# Patient Record
Sex: Male | Born: 1950 | Race: White | Hispanic: No | Marital: Single | Smoking: Never smoker
Health system: Southern US, Community
[De-identification: ages and names within clinical notes are randomized; demographics above are authoritative.]

## PROBLEM LIST (undated history)

## (undated) DIAGNOSIS — J449 Chronic obstructive pulmonary disease, unspecified: Secondary | ICD-10-CM

## (undated) DIAGNOSIS — K4090 Unilateral inguinal hernia, without obstruction or gangrene, not specified as recurrent: Secondary | ICD-10-CM

---

## 2003-08-06 ENCOUNTER — Emergency Department (HOSPITAL_COMMUNITY): Admission: EM | Admit: 2003-08-06 | Discharge: 2003-08-07 | Payer: Self-pay | Admitting: *Deleted

## 2010-12-27 ENCOUNTER — Ambulatory Visit (HOSPITAL_COMMUNITY)
Admission: RE | Admit: 2010-12-27 | Discharge: 2010-12-27 | Disposition: A | Discharge status: Home / Self Care | Payer: BC Managed Care – PPO | Source: Ambulatory Visit | Attending: Family Medicine | Admitting: Family Medicine

## 2010-12-27 ENCOUNTER — Other Ambulatory Visit (HOSPITAL_COMMUNITY): Payer: Self-pay | Admitting: Family Medicine

## 2010-12-27 DIAGNOSIS — R059 Cough, unspecified: Secondary | ICD-10-CM | POA: Insufficient documentation

## 2010-12-27 DIAGNOSIS — R05 Cough: Secondary | ICD-10-CM

## 2010-12-27 DIAGNOSIS — R918 Other nonspecific abnormal finding of lung field: Secondary | ICD-10-CM | POA: Insufficient documentation

## 2011-01-05 ENCOUNTER — Other Ambulatory Visit (HOSPITAL_COMMUNITY): Payer: Self-pay | Admitting: Family Medicine

## 2011-01-05 ENCOUNTER — Ambulatory Visit (HOSPITAL_COMMUNITY)
Admission: RE | Admit: 2011-01-05 | Discharge: 2011-01-05 | Disposition: A | Discharge status: Home / Self Care | Payer: BC Managed Care – PPO | Source: Ambulatory Visit | Attending: Family Medicine | Admitting: Family Medicine

## 2011-01-05 DIAGNOSIS — R0609 Other forms of dyspnea: Secondary | ICD-10-CM

## 2011-01-05 DIAGNOSIS — J449 Chronic obstructive pulmonary disease, unspecified: Secondary | ICD-10-CM

## 2011-01-05 DIAGNOSIS — R0989 Other specified symptoms and signs involving the circulatory and respiratory systems: Secondary | ICD-10-CM | POA: Insufficient documentation

## 2011-01-05 DIAGNOSIS — J4489 Other specified chronic obstructive pulmonary disease: Secondary | ICD-10-CM | POA: Insufficient documentation

## 2011-01-09 ENCOUNTER — Other Ambulatory Visit (HOSPITAL_COMMUNITY): Payer: Self-pay | Admitting: Family Medicine

## 2011-01-09 DIAGNOSIS — R590 Localized enlarged lymph nodes: Secondary | ICD-10-CM

## 2011-01-12 ENCOUNTER — Ambulatory Visit (HOSPITAL_COMMUNITY): Payer: BC Managed Care – PPO

## 2011-01-13 ENCOUNTER — Ambulatory Visit (HOSPITAL_COMMUNITY)
Admission: RE | Admit: 2011-01-13 | Discharge: 2011-01-13 | Disposition: A | Discharge status: Home / Self Care | Payer: BC Managed Care – PPO | Source: Ambulatory Visit | Attending: Family Medicine | Admitting: Family Medicine

## 2011-01-13 DIAGNOSIS — R599 Enlarged lymph nodes, unspecified: Secondary | ICD-10-CM | POA: Insufficient documentation

## 2011-01-13 DIAGNOSIS — R590 Localized enlarged lymph nodes: Secondary | ICD-10-CM

## 2011-01-13 MED ORDER — IOHEXOL 300 MG/ML  SOLN
80.0000 mL | Freq: Once | INTRAMUSCULAR | Status: AC | PRN
  Administered 2011-01-13: 80 mL via INTRAVENOUS

## 2011-07-17 ENCOUNTER — Encounter (HOSPITAL_COMMUNITY): Payer: Self-pay | Admitting: *Deleted

## 2011-07-17 ENCOUNTER — Emergency Department (HOSPITAL_COMMUNITY): Payer: BC Managed Care – PPO

## 2011-07-17 ENCOUNTER — Emergency Department (HOSPITAL_COMMUNITY)
Admission: EM | Admit: 2011-07-17 | Discharge: 2011-07-18 | Disposition: A | Discharge status: Home / Self Care | Payer: BC Managed Care – PPO | Attending: Emergency Medicine | Admitting: Emergency Medicine

## 2011-07-17 DIAGNOSIS — J45901 Unspecified asthma with (acute) exacerbation: Secondary | ICD-10-CM | POA: Insufficient documentation

## 2011-07-17 DIAGNOSIS — Z9114 Patient's other noncompliance with medication regimen: Secondary | ICD-10-CM

## 2011-07-17 DIAGNOSIS — Z91199 Patient's noncompliance with other medical treatment and regimen due to unspecified reason: Secondary | ICD-10-CM | POA: Insufficient documentation

## 2011-07-17 DIAGNOSIS — Z9119 Patient's noncompliance with other medical treatment and regimen: Secondary | ICD-10-CM | POA: Insufficient documentation

## 2011-07-17 MED ORDER — ALBUTEROL SULFATE (5 MG/ML) 0.5% IN NEBU: 5.0000 mg | INHALATION_SOLUTION | Freq: Once | RESPIRATORY_TRACT | Status: DC

## 2011-07-17 MED ORDER — PREDNISONE 10 MG PO TABS: 20.0000 mg | ORAL_TABLET | Freq: Every day | ORAL | Status: DC

## 2011-07-17 MED ORDER — IPRATROPIUM BROMIDE 0.02 % IN SOLN
0.5000 mg | Freq: Once | RESPIRATORY_TRACT | Status: AC
  Administered 2011-07-17: 0.5 mg via RESPIRATORY_TRACT
  Filled 2011-07-17: qty 2.5

## 2011-07-17 MED ORDER — IPRATROPIUM BROMIDE 0.02 % IN SOLN: 0.5000 mg | Freq: Once | RESPIRATORY_TRACT | Status: DC

## 2011-07-17 MED ORDER — ALBUTEROL SULFATE HFA 108 (90 BASE) MCG/ACT IN AERS
2.0000 | INHALATION_SPRAY | RESPIRATORY_TRACT | Status: DC
  Administered 2011-07-18: 2 via RESPIRATORY_TRACT
  Filled 2011-07-17: qty 6.7

## 2011-07-17 MED ORDER — ALBUTEROL SULFATE (5 MG/ML) 0.5% IN NEBU
5.0000 mg | INHALATION_SOLUTION | Freq: Once | RESPIRATORY_TRACT | Status: AC
  Administered 2011-07-17: 5 mg via RESPIRATORY_TRACT
  Filled 2011-07-17: qty 1

## 2011-07-17 MED ORDER — PREDNISONE 20 MG PO TABS
60.0000 mg | ORAL_TABLET | Freq: Once | ORAL | Status: AC
  Administered 2011-07-17: 60 mg via ORAL
  Filled 2011-07-17: qty 3

## 2011-07-17 NOTE — Discharge Instructions (Signed)
Asthma, Adult  Asthma is a disease of the lungs and can make it hard to breathe. Asthma cannot be cured, but medicine can help control it. Asthma may be started (triggered) by:   Pollen.   Dust.   Animal skin flakes (dander).   Molds.   Foods.   Respiratory infections (colds, flu).   Smoke.   Exercise.   Stress.   Other things that cause allergic reactions or allergies (allergens).  HOME CARE    Talk to your doctor about how to manage your attacks at home. This may include:   Using a tool called a peak flow meter.   Having medicine ready to stop the attack.   Take all medicine as told by your doctor.   Wash bed sheets and blankets every week in hot water and put them in the dryer.   Drink enough fluids to keep your pee (urine) clear or pale yellow.   Always be ready to get emergency help. Write down the phone number for your doctor. Keep it where you can easily find it.   Talk about exercise routines with your doctor.   If animal dander is causing your asthma, you may need to find a new home for your pet(s).  GET HELP RIGHT AWAY IF:    You have muscle aches.   You cough more.   You have chest pain.   You have thick spit (sputum) that changes to yellow, green, gray, or bloody.   Medicine does not stop your wheezing.   You have problems breathing.   You have a fever.   Your medicine causes:   A rash.   Itching.   Puffiness (swelling).   Breathing problems.  MAKE SURE YOU:    Understand these instructions.   Will watch your condition.   Will get help right away if you are not doing well or get worse.  Document Released: 09/06/2007 Document Revised: 03/09/2011 Document Reviewed: 01/29/2008  [employer] Patient Information 2012 [employer].

## 2011-07-17 NOTE — ED Notes (Signed)
Sob today,vomited x2 today, No cough,  Diarrhea x2,

## 2011-07-17 NOTE — ED Provider Notes (Signed)
History    This chart was scribed for Brent Quarry, MD, MD by [name]. The patient was seen in room APA05 and the patient's care was started at 10:32PM.   CSN: [id number]  Arrival date & time 07/17/11  2130   First MD Initiated Contact with Patient 07/17/11 2230      Chief Complaint  Patient presents with  . Shortness of Breath    (Consider location/radiation/quality/duration/timing/severity/associated sxs/prior treatment) Patient is a 61 y.o. male presenting with shortness of breath. The history is provided by the patient.  Shortness of Breath  Associated symptoms include shortness of breath.   Brent Collins is a 61 y.o. male who presents to the Emergency Department complaining of moderate SOB onset today. Pt denies using inhaler due to running out. He reports having hx of asthma. He denies ever being admitted to hospital for breathing complications. The symptoms have been constant without radiation. He reports vomiting 2x today. Denies cough. Pt denies any other health problems.   Past Medical History  Diagnosis Date  . Asthma     History reviewed. No pertinent past surgical history.  History reviewed. No pertinent family history.  History  Substance Use Topics  . Smoking status: Never Smoker   . Smokeless tobacco: Not on file  . Alcohol Use: No      Review of Systems  Respiratory: Positive for shortness of breath.   All other systems reviewed and are negative.  10 Systems reviewed and all are negative for acute change except as noted in the HPI.    Allergies  Tomato  Home Medications   Current Outpatient Rx  Name Route Sig Dispense Refill  . GOODY HEADACHE PO Oral Take 1 packet by mouth as needed. For pain      BP 164/107  Pulse 84  Temp(Src) 97.9 F (36.6 C) (Oral)  Resp 24  Wt 139 lb 6.4 oz (63.231 kg)  SpO2 99%  Physical Exam  Nursing note and vitals reviewed. Constitutional: He is oriented to person, place, and time. He appears  well-developed and well-nourished. No distress.  HENT:  Head: Normocephalic and atraumatic.  Eyes: EOM are normal. Pupils are equal, round, and reactive to light.  Neck: Normal range of motion. Neck supple. No tracheal deviation present.  Cardiovascular: Normal rate.   Pulmonary/Chest: Effort normal. No respiratory distress. He has wheezes.  Abdominal: Soft. He exhibits no distension.  Musculoskeletal: Normal range of motion.  Neurological: He is alert and oriented to person, place, and time.  Skin: Skin is warm and dry.  Psychiatric: He has a normal mood and affect. His behavior is normal.    ED Course  Procedures (including critical care time) DIAGNOSTIC STUDIES: Oxygen Saturation is 99% on room air, normal by my interpretation.    COORDINATION OF CARE: 10:36PM EDP discusses pt ED treatment course with pt.  10:36PM EDP ordered medication: albuterol 0.5%, atrovent 0.5 mg    Labs Reviewed - No data to display No results found.   No diagnosis found.    MDM  wheezing s has resolved after one nebulizer treatment. Patient states that he was out of his albuterol inhaler. He is advised that his blood pressure is high here. He will have her rechecked tomorrow. He is given albuterol MDI here. He'll also be placed on prednisone.  I personally performed the services described in this documentation, which was scribed in my presence. The recorded information has been reviewed and considered.     Brent Quarry,  MD 07/17/11 2336

## 2013-03-02 ENCOUNTER — Emergency Department (HOSPITAL_COMMUNITY)
Admission: EM | Admit: 2013-03-02 | Discharge: 2013-03-02 | Disposition: A | Discharge status: Home / Self Care | Payer: BC Managed Care – PPO | Attending: Emergency Medicine | Admitting: Emergency Medicine

## 2013-03-02 ENCOUNTER — Other Ambulatory Visit: Payer: Self-pay

## 2013-03-02 ENCOUNTER — Encounter (HOSPITAL_COMMUNITY): Payer: Self-pay | Admitting: Emergency Medicine

## 2013-03-02 ENCOUNTER — Emergency Department (HOSPITAL_COMMUNITY): Payer: BC Managed Care – PPO

## 2013-03-02 DIAGNOSIS — J45901 Unspecified asthma with (acute) exacerbation: Secondary | ICD-10-CM | POA: Insufficient documentation

## 2013-03-02 DIAGNOSIS — Z79899 Other long term (current) drug therapy: Secondary | ICD-10-CM | POA: Insufficient documentation

## 2013-03-02 LAB — CBC WITH DIFFERENTIAL/PLATELET
Eosinophils Absolute: 0.5 10*3/uL (ref 0.0–0.7)
Eosinophils Relative: 5 % (ref 0–5)
HCT: 48.7 % (ref 39.0–52.0)
Hemoglobin: 16.7 g/dL (ref 13.0–17.0)
Lymphs Abs: 1.8 10*3/uL (ref 0.7–4.0)
MCH: 30.7 pg (ref 26.0–34.0)
MCV: 89.5 fL (ref 78.0–100.0)
Monocytes Absolute: 0.8 10*3/uL (ref 0.1–1.0)
Monocytes Relative: 8 % (ref 3–12)
RBC: 5.44 MIL/uL (ref 4.22–5.81)

## 2013-03-02 LAB — BASIC METABOLIC PANEL
BUN: 19 mg/dL (ref 6–23)
Calcium: 9 mg/dL (ref 8.4–10.5)
Creatinine, Ser: 0.94 mg/dL (ref 0.50–1.35)
GFR calc non Af Amer: 87 mL/min — ABNORMAL LOW (ref >90)
Glucose, Bld: 182 mg/dL — ABNORMAL HIGH (ref 70–99)
Potassium: 3.9 mEq/L (ref 3.5–5.1)

## 2013-03-02 MED ORDER — ALBUTEROL SULFATE HFA 108 (90 BASE) MCG/ACT IN AERS: 2.0000 | INHALATION_SPRAY | RESPIRATORY_TRACT | Status: DC | PRN

## 2013-03-02 MED ORDER — ALBUTEROL SULFATE (5 MG/ML) 0.5% IN NEBU
5.0000 mg | INHALATION_SOLUTION | Freq: Once | RESPIRATORY_TRACT | Status: AC
  Administered 2013-03-02: 5 mg via RESPIRATORY_TRACT
  Filled 2013-03-02: qty 1

## 2013-03-02 MED ORDER — PREDNISONE 10 MG PO TABS: 60.0000 mg | ORAL_TABLET | Freq: Every day | ORAL | Status: DC

## 2013-03-02 MED ORDER — METHYLPREDNISOLONE SODIUM SUCC 125 MG IJ SOLR
125.0000 mg | Freq: Once | INTRAMUSCULAR | Status: AC
  Administered 2013-03-02: 125 mg via INTRAVENOUS
  Filled 2013-03-02: qty 2

## 2013-03-02 MED ORDER — IPRATROPIUM BROMIDE 0.02 % IN SOLN
0.5000 mg | Freq: Once | RESPIRATORY_TRACT | Status: AC
  Administered 2013-03-02: 0.5 mg via RESPIRATORY_TRACT
  Filled 2013-03-02: qty 2.5

## 2013-03-02 NOTE — ED Notes (Signed)
Pt reports sob, pt w/ audible wheezing when came to the ER. Pt states productive cough w/ white phlegm.

## 2013-03-02 NOTE — ED Provider Notes (Signed)
CSN: [id number]     Arrival date & time 03/02/13  0320 History   First MD Initiated Contact with Patient 03/02/13 0331     Chief Complaint  Patient presents with  . Shortness of Breath  . Wheezing   (Consider location/radiation/quality/duration/timing/severity/associated sxs/prior Treatment) The history is provided by the patient.   patient is a known history of asthma.  He states worsening shortness of breath with productive cough and developing phlegm over the past 24-48 hours.  He's tried breathing treatments at home without improvement in his symptoms.  He's never been hospitalized or intubated for his asthma.  He denies chest pain at this time.  Mild shortness of breath.  No recent fevers or chills.  No abdominal pain.  No back pain or flank pain.  No neck pain.  He does not smoke cigarettes.  Past Medical History  Diagnosis Date  . Asthma    History reviewed. No pertinent past surgical history. No family history on file. History  Substance Use Topics  . Smoking status: Never Smoker   . Smokeless tobacco: Not on file  . Alcohol Use: No    Review of Systems  All other systems reviewed and are negative.    Allergies  Tomato  Home Medications   Current Outpatient Rx  Name  Route  Sig  Dispense  Refill  . Aspirin-Acetaminophen-Caffeine (GOODY HEADACHE PO)   Oral   Take 1 packet by mouth as needed. For pain         . albuterol (PROVENTIL HFA;VENTOLIN HFA) 108 (90 BASE) MCG/ACT inhaler   Inhalation   Inhale 2 puffs into the lungs every 4 (four) hours as needed for wheezing or shortness of breath.   1 Inhaler   0   . predniSONE (DELTASONE) 10 MG tablet   Oral   Take 2 tablets (20 mg total) by mouth daily.   15 tablet   0   . predniSONE (DELTASONE) 10 MG tablet   Oral   Take 6 tablets (60 mg total) by mouth daily.   30 tablet   0    BP 134/80  Pulse 83  Temp(Src) 97.6 F (36.4 C) (Oral)  Resp 20  Ht 5\' 10"  (1.778 m)  Wt 150 lb (68.04 kg)  BMI 21.52  kg/m2  SpO2 98% Physical Exam  Nursing note and vitals reviewed. Constitutional: He is oriented to person, place, and time. He appears well-developed and well-nourished.  HENT:  Head: Normocephalic and atraumatic.  Eyes: EOM are normal.  Neck: Normal range of motion.  Cardiovascular: Normal rate, regular rhythm, normal heart sounds and intact distal pulses.   Pulmonary/Chest: No accessory muscle usage. Tachypnea noted. Not bradypneic. No respiratory distress. He has no decreased breath sounds. He has wheezes. He has no rhonchi. He has no rales.  Abdominal: Soft. He exhibits no distension. There is no tenderness.  Musculoskeletal: Normal range of motion. He exhibits no edema and no tenderness.  Neurological: He is alert and oriented to person, place, and time.  Skin: Skin is warm and dry.  Psychiatric: He has a normal mood and affect. Judgment normal.    ED Course  Procedures (including critical care time) Labs Review Labs Reviewed  BASIC METABOLIC PANEL - Abnormal; Notable for the following:    Glucose, Bld 182 (*)    GFR calc non Af Amer 87 (*)    All other components within normal limits  CBC WITH DIFFERENTIAL   Imaging Review Dg Chest Portable 1 View  03/02/2013  CLINICAL DATA:  Shortness of breath.  History of asthma.  EXAM: PORTABLE CHEST - 1 VIEW  COMPARISON:  07/17/2011  FINDINGS: Normal heart size and pulmonary vascularity. Central interstitial changes with peribronchial thickening suggesting chronic bronchitis. Stable appearance since previous study. No focal consolidation or airspace disease. Vague nodular opacity over the right mid lung is probably a prominent nipple shadow. No blunting of costophrenic angles. No pneumothorax.  IMPRESSION: Chronic bronchitic changes.  No evidence of active pulmonary disease   Electronically Signed   By: Burman Nieves M.D.   On: 03/02/2013 04:35    ECG interpretation   Date: 03/02/2013  Rate: 89  Rhythm: normal sinus rhythm  QRS  Axis: normal  Intervals: normal  ST/T Wave abnormalities: nonspecific ST changes  Conduction Disutrbances: none  Narrative Interpretation: artifact present, poor baseline  Old EKG Reviewed: No significant changes noted     MDM   1. Asthma exacerbation   6:12 AM Patient feels much better this time.  Wheezing is resolved.  Chest x-ray without abnormalities.  Discharge home with prednisone and an ongoing schedule albuterol for the first 48 hours.  PCP followup.  Understands to return to the ER for new or worsening symptoms  Discharge Medication List as of 03/02/2013  5:57 AM    START taking these medications   Details  albuterol (PROVENTIL HFA;VENTOLIN HFA) 108 (90 BASE) MCG/ACT inhaler Inhale 2 puffs into the lungs every 4 (four) hours as needed for wheezing or shortness of breath., Starting 03/02/2013, Until Discontinued, Print    !! predniSONE (DELTASONE) 10 MG tablet Take 6 tablets (60 mg total) by mouth daily., Starting 03/02/2013, Until Discontinued, Print            Lyanne Co, MD 03/02/13 [phone]

## 2013-03-02 NOTE — ED Notes (Signed)
Pt alert & oriented x4, stable gait. Patient given discharge instructions, paperwork & prescription(s). Patient  instructed to stop at the registration desk to finish any additional paperwork. Patient verbalized understanding. Pt left department w/ no further questions. 

## 2013-03-02 NOTE — ED Notes (Signed)
Pt states breathing better after treatment, pt still has scattered wheezing on occasion.

## 2013-05-10 ENCOUNTER — Encounter (HOSPITAL_COMMUNITY): Payer: Self-pay | Admitting: Emergency Medicine

## 2013-05-10 ENCOUNTER — Emergency Department (HOSPITAL_COMMUNITY)
Admission: EM | Admit: 2013-05-10 | Discharge: 2013-05-10 | Disposition: A | Discharge status: Home / Self Care | Payer: BC Managed Care – PPO | Attending: Emergency Medicine | Admitting: Emergency Medicine

## 2013-05-10 ENCOUNTER — Emergency Department (HOSPITAL_COMMUNITY): Payer: BC Managed Care – PPO

## 2013-05-10 DIAGNOSIS — R112 Nausea with vomiting, unspecified: Secondary | ICD-10-CM | POA: Insufficient documentation

## 2013-05-10 DIAGNOSIS — R000 Tachycardia, unspecified: Secondary | ICD-10-CM | POA: Insufficient documentation

## 2013-05-10 DIAGNOSIS — R609 Edema, unspecified: Secondary | ICD-10-CM | POA: Insufficient documentation

## 2013-05-10 DIAGNOSIS — J45901 Unspecified asthma with (acute) exacerbation: Secondary | ICD-10-CM

## 2013-05-10 HISTORY — DX: Unilateral inguinal hernia, without obstruction or gangrene, not specified as recurrent: K40.90

## 2013-05-10 LAB — BASIC METABOLIC PANEL
BUN: 16 mg/dL (ref 6–23)
CHLORIDE: 100 meq/L (ref 96–112)
CO2: 28 meq/L (ref 19–32)
CREATININE: 0.8 mg/dL (ref 0.50–1.35)
Calcium: 9.1 mg/dL (ref 8.4–10.5)
GFR calc non Af Amer: >90 mL/min (ref >90)
Glucose, Bld: 244 mg/dL — ABNORMAL HIGH (ref 70–99)
POTASSIUM: 3.8 meq/L (ref 3.7–5.3)
Sodium: 142 mEq/L (ref 137–147)

## 2013-05-10 LAB — CBC
HEMATOCRIT: 45.4 % (ref 39.0–52.0)
Hemoglobin: 15.5 g/dL (ref 13.0–17.0)
MCH: 30.5 pg (ref 26.0–34.0)
MCHC: 34.1 g/dL (ref 30.0–36.0)
MCV: 89.2 fL (ref 78.0–100.0)
PLATELETS: 213 10*3/uL (ref 150–400)
RBC: 5.09 MIL/uL (ref 4.22–5.81)
RDW: 13.4 % (ref 11.5–15.5)
WBC: 13.6 10*3/uL — AB (ref 4.0–10.5)

## 2013-05-10 MED ORDER — ALBUTEROL SULFATE (2.5 MG/3ML) 0.083% IN NEBU
5.0000 mg | INHALATION_SOLUTION | Freq: Once | RESPIRATORY_TRACT | Status: AC
  Administered 2013-05-10: 5 mg via RESPIRATORY_TRACT
  Filled 2013-05-10: qty 6

## 2013-05-10 MED ORDER — MAGNESIUM SULFATE 40 MG/ML IJ SOLN
2.0000 g | Freq: Once | INTRAMUSCULAR | Status: AC
  Administered 2013-05-10: 2 g via INTRAVENOUS
  Filled 2013-05-10: qty 50

## 2013-05-10 MED ORDER — PREDNISONE 20 MG PO TABS: 60.0000 mg | ORAL_TABLET | Freq: Every day | ORAL | Status: DC

## 2013-05-10 MED ORDER — ALBUTEROL SULFATE HFA 108 (90 BASE) MCG/ACT IN AERS
2.0000 | INHALATION_SPRAY | RESPIRATORY_TRACT | Status: DC | PRN
  Administered 2013-05-10: 2 via RESPIRATORY_TRACT
  Filled 2013-05-10: qty 6.7

## 2013-05-10 MED ORDER — ONDANSETRON HCL 4 MG/2ML IJ SOLN
INTRAMUSCULAR | Status: AC
  Filled 2013-05-10: qty 2

## 2013-05-10 MED ORDER — ONDANSETRON HCL 4 MG/2ML IJ SOLN
4.0000 mg | Freq: Once | INTRAMUSCULAR | Status: AC
  Administered 2013-05-10: 4 mg via INTRAVENOUS

## 2013-05-10 MED ORDER — ALBUTEROL SULFATE HFA 108 (90 BASE) MCG/ACT IN AERS: 1.0000 | INHALATION_SPRAY | Freq: Four times a day (QID) | RESPIRATORY_TRACT | Status: DC | PRN

## 2013-05-10 NOTE — Discharge Instructions (Signed)
Asthma, Acute Bronchospasm Rest today and use your inhaler as needed and as prescribed.  Take prednisone daily for the next 5 days . Call your physician to schedule close followup in the office.   Return here for any return of symptoms, difficulty breathing or concerning condition.   Acute bronchospasm caused by asthma is also referred to as an asthma attack. Bronchospasm means your air passages become narrowed. The narrowing is caused by inflammation and tightening of the muscles in the air tubes (bronchi) in your lungs. This can make it hard to breath or cause you to wheeze and cough. CAUSES Possible triggers are:  Animal dander from the skin, hair, or feathers of animals.  Dust mites contained in house dust.  Cockroaches.  Pollen from trees or grass.  Mold.  Cigarette or tobacco smoke.  Air pollutants such as dust, household cleaners, hair sprays, aerosol sprays, paint fumes, strong chemicals, or strong odors.  Cold air or weather changes. Cold air may trigger inflammation. Winds increase molds and pollens in the air.  Strong emotions such as crying or laughing hard.  Stress.  Certain medicines such as aspirin or beta-blockers.  Sulfites in foods and drinks, such as dried fruits and wine.  Infections or inflammatory conditions, such as a flu, cold, or inflammation of the nasal membranes (rhinitis).  Gastroesophageal reflux disease (GERD). GERD is a condition where stomach acid backs up into your throat (esophagus).  Exercise or strenuous activity. SIGNS AND SYMPTOMS   Wheezing.  Excessive coughing, particularly at night.  Chest tightness.  Shortness of breath. DIAGNOSIS  Your health care provider will ask you about your medical history and perform a physical exam. A chest X-ray or blood testing may be performed to look for other causes of your symptoms or other conditions that may have triggered your asthma attack. TREATMENT  Treatment is aimed at reducing  inflammation and opening up the airways in your lungs. Most asthma attacks are treated with inhaled medicines. These include quick relief or rescue medicines (such as bronchodilators) and controller medicines (such as inhaled corticosteroids). These medicines are sometimes given through an inhaler or a nebulizer. Systemic steroid medicine taken by mouth or given through an IV tube also can be used to reduce the inflammation when an attack is moderate or severe. Antibiotic medicines are only used if a bacterial infection is present.  HOME CARE INSTRUCTIONS   Rest.  Drink plenty of liquids. This helps the mucus to remain thin and be easily coughed up. Only use caffeine in moderation and do not use alcohol until you have recovered from your illness.  Do not smoke. Avoid being exposed to secondhand smoke.  You play a critical role in keeping yourself in good health. Avoid exposure to things that cause you to wheeze or to have breathing problems.  Keep your medicines up to date and available. Carefully follow your health care provider's treatment plan.  Take your medicine exactly as prescribed.  When pollen or pollution is bad, keep windows closed and use an air conditioner or go to places with air conditioning.  Asthma requires careful medical care. See your health care provider for a follow-up as advised. If you are more than [redacted] weeks pregnant and you were prescribed any new medicines, let your obstetrician know about the visit and how you are doing. Follow-up with your health care provider as directed.  After you have recovered from your asthma attack, make an appointment with your outpatient doctor to talk about ways to reduce  the likelihood of future attacks. If you do not have a doctor who manages your asthma, make an appointment with a primary care doctor to discuss your asthma. SEEK IMMEDIATE MEDICAL CARE IF:   You are getting worse.  You have trouble breathing. If severe, call your local  emergency services (911 in the [place]  You develop chest pain or discomfort.  You are vomiting.  You are not able to keep fluids down.  You are coughing up yellow, green, brown, or bloody sputum.  You have a fever and your symptoms suddenly get worse.  You have trouble swallowing. MAKE SURE YOU:   Understand these instructions.  Will watch your condition.  Will get help right away if you are not doing well or get worse. Document Released: 07/05/2006 Document Revised: 11/20/2012 Document Reviewed: 09/25/2012 [facility] Patient Information 2014 [place], [place].

## 2013-05-10 NOTE — ED Notes (Signed)
O2 sats 88on RA w/good pleth.  Placed 2L [place] w/SpO2 up to 96%

## 2013-05-10 NOTE — ED Notes (Signed)
Patient with no complaints at this time. Respirations even and unlabored. Skin warm/dry. Discharge instructions reviewed with patient at this time. Patient given opportunity to voice concerns/ask questions. IV removed per policy and band-aid applied to site. Patient discharged at this time and left Emergency Department with steady gait.  

## 2013-05-10 NOTE — ED Provider Notes (Signed)
CSN: [id number]     Arrival date & time 05/10/13  0139 History   None    Chief Complaint  Patient presents with  . Shortness of Breath   (Consider location/radiation/quality/duration/timing/severity/associated sxs/prior Treatment) HPI History provided by patient. History of asthma, ran out of his inhaler tonight with progressively worsening symptoms. EMS was called he received albuterol neb in route, in addition to IV site Medrol 125 mg. he reported some subjective improvement. While ambulating into the ER from the ambulance, vomited x1. He reports some mild nausea at this time. No recent fevers or chills. Has a dry cough tonight. No nausea vomiting or diarrhea otherwise. Symptoms moderate to severe, worse with exertion. No known sick contacts. No recent travel. Feels like an asthma exacerbation.  Past Medical History  Diagnosis Date  . Asthma    No past surgical history on file. No family history on file. History  Substance Use Topics  . Smoking status: Never Smoker   . Smokeless tobacco: Not on file  . Alcohol Use: No    Review of Systems  Constitutional: Negative for fever and chills.  Respiratory: Positive for shortness of breath and wheezing.   Cardiovascular: Negative for chest pain.  Gastrointestinal: Positive for nausea and vomiting. Negative for abdominal pain.  Genitourinary: Negative for flank pain.  Musculoskeletal: Negative for back pain.  Skin: Negative for rash.  Neurological: Negative for weakness and numbness.  All other systems reviewed and are negative.    Allergies  Tomato  Home Medications   Current Outpatient Rx  Name  Route  Sig  Dispense  Refill  . albuterol (PROVENTIL HFA;VENTOLIN HFA) 108 (90 BASE) MCG/ACT inhaler   Inhalation   Inhale 2 puffs into the lungs every 4 (four) hours as needed for wheezing or shortness of breath.   1 Inhaler   0   . Aspirin-Acetaminophen-Caffeine (GOODY HEADACHE PO)   Oral   Take 1 packet by mouth as needed. For  pain         . predniSONE (DELTASONE) 10 MG tablet   Oral   Take 2 tablets (20 mg total) by mouth daily.   15 tablet   0   . predniSONE (DELTASONE) 10 MG tablet   Oral   Take 6 tablets (60 mg total) by mouth daily.   30 tablet   0    BP 139/87  Pulse 100  Resp 22  Ht 5\' 6"  (1.676 m)  Wt 150 lb (68.04 kg)  BMI 24.22 kg/m2  SpO2 100% Physical Exam  Constitutional: He is oriented to person, place, and time. He appears well-developed and well-nourished.  HENT:  Head: Normocephalic and atraumatic.  Mouth/Throat: Oropharynx is clear and moist.  Eyes: EOM are normal. Pupils are equal, round, and reactive to light.  Neck: Neck supple. No tracheal deviation present.  Cardiovascular: Regular rhythm and intact distal pulses.   Borderline tachycardia heart rate 100  Pulmonary/Chest: No stridor.  Tachypnea. Bilateral inspiratory and expiratory wheezes  Abdominal: Soft. He exhibits no distension. There is no tenderness.  Musculoskeletal: Normal range of motion. He exhibits no tenderness.  Symmetric trace pretibial edema  Neurological: He is alert and oriented to person, place, and time.  Skin: Skin is warm and dry.    ED Course  Procedures (including critical care time) Labs Review Labs Reviewed  CBC - Abnormal; Notable for the following:    WBC 13.6 (*)    All other components within normal limits  BASIC METABOLIC PANEL - Abnormal; Notable for  the following:    Glucose, Bld 244 (*)    All other components within normal limits   Imaging Review Dg Chest Portable 1 View  05/10/2013   CLINICAL DATA:  63 year old male with shortness of breath.  EXAM: PORTABLE CHEST - 1 VIEW  COMPARISON:  03/02/2013 prior chest radiographs dating back to 12/27/2010  FINDINGS: The cardiomediastinal silhouette is unremarkable.  Mild peribronchial thickening again noted.  There is no evidence of focal airspace disease, pulmonary edema, suspicious pulmonary nodule/mass, pleural effusion, or  pneumothorax. No acute bony abnormalities are identified.  Severe degenerative changes in both shoulders again noted.  IMPRESSION: No active disease.   Electronically Signed   By: Laveda Abbe M.D.   On: 05/10/2013 02:15     Date: 05/10/2013  Rate: 100  Rhythm: normal sinus rhythm  QRS Axis: normal  Intervals: normal  ST/T Wave abnormalities: nonspecific ST changes  Conduction Disutrbances:none  Narrative Interpretation:   Old EKG Reviewed: none available  IV Steroids PTA Albuterol and IV magnesium provided  2:40 AM recheck reports some improvement in his breathing/ appears comfortable, lung sounds remain insp/ exp wheezes, moving air better. Repeated albuterol. No further emesis. Nausea resolved. 5:29 AM recheck at this time in feeling significantly better ambulating without any further shortness of breath or wheezes. Lungs sounds: Moving good air, has some intermittent expiratory wheezes but overall much improved. Patient is requesting to be discharged home. Albuterol inhaler provided  Plan discharge home with close outpatient followup. Prescription for prednisone 60 mg x 5 days.  Patient agrees to strict return precautions.  MDM  Diagnosis: Acute asthma exacerbation  Evaluated with EKG, chest x-ray and labs are reviewed as above Patient received IV steroids in route, treated with albuterol nebulizers in the ED And IV magnesium. Condition improved. Vital signs and nursing notes and previous records reviewed and considered.   Sunnie Nielsen, MD 05/10/13 [phone]

## 2013-05-10 NOTE — ED Notes (Signed)
C/O SOB after 1900.  EMS called.  Received Albuterol neb and 125mg  Solumedrol IV on site.  Began vomiting upon arrival to ER.  Hx of asthma, bronchitis.  Has run out of albuterol at home.

## 2015-11-01 ENCOUNTER — Ambulatory Visit (HOSPITAL_COMMUNITY)
Admission: RE | Admit: 2015-11-01 | Discharge: 2015-11-01 | Disposition: A | Discharge status: Home / Self Care | Payer: Self-pay | Source: Ambulatory Visit | Attending: Internal Medicine | Admitting: Internal Medicine

## 2015-11-01 ENCOUNTER — Other Ambulatory Visit (HOSPITAL_COMMUNITY): Payer: Self-pay | Admitting: Internal Medicine

## 2015-11-01 DIAGNOSIS — R0689 Other abnormalities of breathing: Secondary | ICD-10-CM

## 2015-11-01 DIAGNOSIS — J45909 Unspecified asthma, uncomplicated: Secondary | ICD-10-CM | POA: Insufficient documentation

## 2015-11-01 DIAGNOSIS — R918 Other nonspecific abnormal finding of lung field: Secondary | ICD-10-CM | POA: Insufficient documentation

## 2016-07-03 ENCOUNTER — Emergency Department (HOSPITAL_COMMUNITY): Payer: Medicare Other

## 2016-07-03 ENCOUNTER — Inpatient Hospital Stay (HOSPITAL_COMMUNITY)
Admission: EM | Admit: 2016-07-03 | Discharge: 2016-07-07 | DRG: 292 | Disposition: A | Discharge status: Home / Self Care | Payer: Medicare Other | Attending: Internal Medicine | Admitting: Internal Medicine

## 2016-07-03 ENCOUNTER — Encounter (HOSPITAL_COMMUNITY): Payer: Self-pay | Admitting: *Deleted

## 2016-07-03 DIAGNOSIS — I5033 Acute on chronic diastolic (congestive) heart failure: Secondary | ICD-10-CM | POA: Diagnosis present

## 2016-07-03 DIAGNOSIS — I110 Hypertensive heart disease with heart failure: Principal | ICD-10-CM | POA: Diagnosis present

## 2016-07-03 DIAGNOSIS — E860 Dehydration: Secondary | ICD-10-CM | POA: Diagnosis present

## 2016-07-03 DIAGNOSIS — R739 Hyperglycemia, unspecified: Secondary | ICD-10-CM | POA: Diagnosis present

## 2016-07-03 DIAGNOSIS — J441 Chronic obstructive pulmonary disease with (acute) exacerbation: Secondary | ICD-10-CM | POA: Diagnosis not present

## 2016-07-03 DIAGNOSIS — I509 Heart failure, unspecified: Secondary | ICD-10-CM | POA: Diagnosis not present

## 2016-07-03 DIAGNOSIS — Z79899 Other long term (current) drug therapy: Secondary | ICD-10-CM

## 2016-07-03 DIAGNOSIS — Z7982 Long term (current) use of aspirin: Secondary | ICD-10-CM | POA: Diagnosis not present

## 2016-07-03 DIAGNOSIS — R0602 Shortness of breath: Secondary | ICD-10-CM

## 2016-07-03 HISTORY — DX: Chronic obstructive pulmonary disease, unspecified: J44.9

## 2016-07-03 LAB — CBC WITH DIFFERENTIAL/PLATELET
Basophils Absolute: 0 10*3/uL (ref 0.0–0.1)
Basophils Relative: 0 %
Eosinophils Absolute: 0.6 10*3/uL (ref 0.0–0.7)
Eosinophils Relative: 9 %
HCT: 47 % (ref 39.0–52.0)
HEMOGLOBIN: 15.7 g/dL (ref 13.0–17.0)
LYMPHS ABS: 1.2 10*3/uL (ref 0.7–4.0)
Lymphocytes Relative: 16 %
MCH: 30.2 pg (ref 26.0–34.0)
MCHC: 33.4 g/dL (ref 30.0–36.0)
MCV: 90.4 fL (ref 78.0–100.0)
Monocytes Absolute: 0.7 10*3/uL (ref 0.1–1.0)
Monocytes Relative: 10 %
NEUTROS PCT: 65 %
Neutro Abs: 4.5 10*3/uL (ref 1.7–7.7)
Platelets: 209 10*3/uL (ref 150–400)
RBC: 5.2 MIL/uL (ref 4.22–5.81)
RDW: 13.9 % (ref 11.5–15.5)
WBC: 7.1 10*3/uL (ref 4.0–10.5)

## 2016-07-03 LAB — COMPREHENSIVE METABOLIC PANEL
ALK PHOS: 83 U/L (ref 38–126)
ALT: 11 U/L — AB (ref 17–63)
ANION GAP: 10 (ref 5–15)
AST: 24 U/L (ref 15–41)
Albumin: 4.1 g/dL (ref 3.5–5.0)
BILIRUBIN TOTAL: 1 mg/dL (ref 0.3–1.2)
BUN: 24 mg/dL — ABNORMAL HIGH (ref 6–20)
CALCIUM: 9.6 mg/dL (ref 8.9–10.3)
CO2: 29 mmol/L (ref 22–32)
CREATININE: 0.89 mg/dL (ref 0.61–1.24)
Chloride: 102 mmol/L (ref 101–111)
GFR calc non Af Amer: >60 mL/min (ref >60)
GLUCOSE: 133 mg/dL — AB (ref 65–99)
Potassium: 4.1 mmol/L (ref 3.5–5.1)
SODIUM: 141 mmol/L (ref 135–145)
TOTAL PROTEIN: 7.6 g/dL (ref 6.5–8.1)

## 2016-07-03 LAB — INFLUENZA PANEL BY PCR (TYPE A & B)
Influenza A By PCR: NEGATIVE
Influenza B By PCR: NEGATIVE

## 2016-07-03 MED ORDER — LORATADINE 10 MG PO TABS
10.0000 mg | ORAL_TABLET | Freq: Every day | ORAL | Status: DC
  Administered 2016-07-03 – 2016-07-07 (×5): 10 mg via ORAL
  Filled 2016-07-03 (×5): qty 1

## 2016-07-03 MED ORDER — DOXYCYCLINE HYCLATE 100 MG PO TABS
100.0000 mg | ORAL_TABLET | Freq: Two times a day (BID) | ORAL | Status: DC
  Administered 2016-07-04 – 2016-07-07 (×7): 100 mg via ORAL
  Filled 2016-07-03 (×7): qty 1

## 2016-07-03 MED ORDER — ALBUTEROL SULFATE (2.5 MG/3ML) 0.083% IN NEBU: 5.0000 mg | INHALATION_SOLUTION | RESPIRATORY_TRACT | Status: AC | PRN

## 2016-07-03 MED ORDER — METHYLPREDNISOLONE SODIUM SUCC 125 MG IJ SOLR
60.0000 mg | Freq: Four times a day (QID) | INTRAMUSCULAR | Status: DC
  Administered 2016-07-03 – 2016-07-05 (×8): 60 mg via INTRAVENOUS
  Filled 2016-07-03 (×9): qty 2

## 2016-07-03 MED ORDER — ALBUTEROL (5 MG/ML) CONTINUOUS INHALATION SOLN
INHALATION_SOLUTION | RESPIRATORY_TRACT | Status: AC
  Administered 2016-07-03: 10 mg/h
  Filled 2016-07-03: qty 20

## 2016-07-03 MED ORDER — DEXTROSE 5 % IV SOLN
500.0000 mg | Freq: Once | INTRAVENOUS | Status: AC
  Administered 2016-07-03: 500 mg via INTRAVENOUS
  Filled 2016-07-03: qty 500

## 2016-07-03 MED ORDER — DOXYCYCLINE HYCLATE 100 MG PO TABS: 100.0000 mg | ORAL_TABLET | Freq: Two times a day (BID) | ORAL | Status: DC

## 2016-07-03 MED ORDER — IPRATROPIUM BROMIDE 0.02 % IN SOLN
RESPIRATORY_TRACT | Status: AC
  Administered 2016-07-03: 0.5 mg
  Filled 2016-07-03: qty 2.5

## 2016-07-03 MED ORDER — ONDANSETRON HCL 4 MG/2ML IJ SOLN: 4.0000 mg | Freq: Four times a day (QID) | INTRAMUSCULAR | Status: DC | PRN

## 2016-07-03 MED ORDER — TRAMADOL HCL 50 MG PO TABS: 100.0000 mg | ORAL_TABLET | Freq: Four times a day (QID) | ORAL | Status: DC | PRN

## 2016-07-03 MED ORDER — CEFTRIAXONE SODIUM 1 G IJ SOLR
1.0000 g | Freq: Once | INTRAMUSCULAR | Status: AC
  Administered 2016-07-03: 1 g via INTRAVENOUS
  Filled 2016-07-03: qty 10

## 2016-07-03 MED ORDER — GUAIFENESIN-DM 100-10 MG/5ML PO SYRP: 5.0000 mL | ORAL_SOLUTION | ORAL | Status: DC | PRN

## 2016-07-03 MED ORDER — METHYLPREDNISOLONE SODIUM SUCC 125 MG IJ SOLR
125.0000 mg | Freq: Once | INTRAMUSCULAR | Status: AC
  Administered 2016-07-03: 125 mg via INTRAVENOUS
  Filled 2016-07-03: qty 2

## 2016-07-03 MED ORDER — ACETAMINOPHEN 325 MG PO TABS
650.0000 mg | ORAL_TABLET | ORAL | Status: DC | PRN
  Administered 2016-07-06: 650 mg via ORAL
  Filled 2016-07-03: qty 2

## 2016-07-03 MED ORDER — IPRATROPIUM-ALBUTEROL 0.5-2.5 (3) MG/3ML IN SOLN
3.0000 mL | Freq: Four times a day (QID) | RESPIRATORY_TRACT | Status: DC
  Administered 2016-07-03 – 2016-07-05 (×11): 3 mL via RESPIRATORY_TRACT
  Filled 2016-07-03 (×11): qty 3

## 2016-07-03 MED ORDER — SODIUM CHLORIDE 0.9 % IV SOLN
INTRAVENOUS | Status: DC
  Administered 2016-07-03 – 2016-07-05 (×4): via INTRAVENOUS

## 2016-07-03 MED ORDER — ENOXAPARIN SODIUM 40 MG/0.4ML ~~LOC~~ SOLN
40.0000 mg | SUBCUTANEOUS | Status: DC
  Administered 2016-07-03 – 2016-07-06 (×4): 40 mg via SUBCUTANEOUS
  Filled 2016-07-03 (×4): qty 0.4

## 2016-07-03 NOTE — ED Notes (Signed)
RT called for neb tx.

## 2016-07-03 NOTE — ED Triage Notes (Signed)
Pt c/o productive cough with som sob x 3 days

## 2016-07-03 NOTE — ED Notes (Signed)
RT called for breathing tx. 

## 2016-07-03 NOTE — H&P (Signed)
History and Physical    Brent Collins UJW:[id number] DOB: [birth year] DOA: 07/03/2016  PCP: Alice Reichert, MD (Inactive)  Patient coming from: Home.   Chief Complaint: sob   HPI: Brent Collins is a 66 y.o. male with medical history significant of with COPD,a sthma, inguinal hernia, came in for worsening sob, since 3 days, with productive cough, associated with wheezing. On arrival to ED, he was found to have wheezing, lab work up was  Not significant. CXR does not show any pneumonia. He was referred to medical service for admission for copd exacerbation.    Review of Systems: As per HPI otherwise 10 point review of systems negative.    Past Medical History:  Diagnosis Date  . Asthma   . COPD (chronic obstructive pulmonary disease) (HCC)   . Inguinal hernia     History reviewed. No pertinent surgical history.   reports that he has never smoked. He has never used smokeless tobacco. He reports that he does not drink alcohol or use drugs.  Allergies  Allergen Reactions  . Tomato Shortness Of Breath and Nausea And Vomiting    History reviewed. No pertinent family history.   Prior to Admission medications   Medication Sig Start Date End Date Taking? Authorizing Provider  albuterol (PROVENTIL HFA;VENTOLIN HFA) 108 (90 BASE) MCG/ACT inhaler Inhale 2 puffs into the lungs every 4 (four) hours as needed for wheezing or shortness of breath. 03/02/13  Yes Azalia Bilis, MD    Physical Exam: Vitals:   07/03/16 0900 07/03/16 0907 07/03/16 0912 07/03/16 1014  BP: 135/84   (!) 160/92  Pulse: 88   95  Resp:    16  Temp:  97.8 F (36.6 C)  97.5 F (36.4 C)  TempSrc:    Oral  SpO2: 95%  97% 98%  Weight:    59 kg (130 lb)  Height:        Constitutional: NAD, calm, comfortable Vitals:   07/03/16 0900 07/03/16 0907 07/03/16 0912 07/03/16 1014  BP: 135/84   (!) 160/92  Pulse: 88   95  Resp:    16  Temp:  97.8 F (36.6 C)  97.5 F (36.4 C)  TempSrc:    Oral  SpO2: 95%  97%  98%  Weight:    59 kg (130 lb)  Height:       Eyes: PERRL, lids and conjunctivae normal ENMT: Mucous membranes are moist. Posterior pharynx clear of any exudate or lesions.Normal dentition.  Neck: normal, supple, no masses, no thyromegaly Respiratory: bilateral wheezing and tachypnea  Cardiovascular: Regular rate and rhythm, no murmurs / rubs / gallops. No extremity edema. 2+ pedal pulses. No carotid bruits.  Abdomen: no tenderness, no masses palpated. No hepatosplenomegaly. Bowel sounds positive.  Musculoskeletal: no clubbing / cyanosis. No joint deformity upper and lower extremities. Good ROM, no contractures. Normal muscle tone.  Skin: no rashes, lesions, ulcers. No induration Neurologic: CN 2-12 grossly intact. Sensation intact, DTR normal. Strength 5/5 in all 4.  Psychiatric: Normal judgment and insight. Alert and oriented x 3. Normal mood.     Labs on Admission: I have personally reviewed following labs and imaging studies  CBC:  Recent Labs Lab 07/03/16 0523  WBC 7.1  NEUTROABS 4.5  HGB 15.7  HCT 47.0  MCV 90.4  PLT 209   Basic Metabolic Panel:  Recent Labs Lab 07/03/16 0523  NA 141  K 4.1  CL 102  CO2 29  GLUCOSE 133*  BUN 24*  CREATININE 0.89  CALCIUM 9.6   GFR: Estimated Creatinine Clearance: 68.1 mL/min (by C-G formula based on SCr of 0.89 mg/dL). Liver Function Tests:  Recent Labs Lab 07/03/16 0523  AST 24  ALT 11*  ALKPHOS 83  BILITOT 1.0  PROT 7.6  ALBUMIN 4.1   No results for input(s): LIPASE, AMYLASE in the last 168 hours. No results for input(s): AMMONIA in the last 168 hours. Coagulation Profile: No results for input(s): INR, PROTIME in the last 168 hours. Cardiac Enzymes: No results for input(s): CKTOTAL, CKMB, CKMBINDEX, TROPONINI in the last 168 hours. BNP (last 3 results) No results for input(s): PROBNP in the last 8760 hours. HbA1C: No results for input(s): HGBA1C in the last 72 hours. CBG: No results for input(s): GLUCAP in  the last 168 hours. Lipid Profile: No results for input(s): CHOL, HDL, LDLCALC, TRIG, CHOLHDL, LDLDIRECT in the last 72 hours. Thyroid Function Tests: No results for input(s): TSH, T4TOTAL, FREET4, T3FREE, THYROIDAB in the last 72 hours. Anemia Panel: No results for input(s): VITAMINB12, FOLATE, FERRITIN, TIBC, IRON, RETICCTPCT in the last 72 hours. Urine analysis: No results found for: COLORURINE, APPEARANCEUR, LABSPEC, PHURINE, GLUCOSEU, HGBUR, BILIRUBINUR, KETONESUR, PROTEINUR, UROBILINOGEN, NITRITE, LEUKOCYTESUR  Radiological Exams on Admission: Dg Chest 2 View  Result Date: 07/03/2016 CLINICAL DATA:  Productive cough and shortness of breath for 3 days. History of asthma. EXAM: CHEST  2 VIEW COMPARISON:  Chest radiograph November 01, 2015 FINDINGS: Cardiomediastinal silhouette is normal, mildly calcified aortic knob. Increased lung volumes without pleural effusion or focal consolidation. No pneumothorax. Soft tissue planes and included osseous structures are nonsuspicious. Severe RIGHT and moderate LEFT shoulder degenerative change. IMPRESSION: COPD without focal consolidation. Electronically Signed   By: Awilda Metro M.D.   On: 07/03/2016 03:22    EKG: not done.   Assessment/Plan Active Problems:   COPD with acute exacerbation (HCC)  acute copd exacerbation:  Admitted for IV steroids, duonebs . Will need advair or dulera on discharge.  Pt reports not smoking.  Doxycycline added on for bronchitis.  Robitussin for cough.    Dehydration: started on IV fluids.    DVT prophylaxis: lovenox.  Code Status: full code.  Family Communication: none at bedside.  Disposition Plan: home in 1 to 2 days.  Consults called: none.  Admission status: inpatient.    Kathlen Mody MD [facility] Pager [phone]   If 7PM-7AM, please contact night-coverage [email or web address] Password [facility]  07/03/2016, 10:38 AM

## 2016-07-03 NOTE — ED Provider Notes (Signed)
[facility]-EMERGENCY DEPT Provider Note   CSN: [id number] Arrival date & time: 07/03/16  0218  Time seen 05:05 AM   History   Chief Complaint Chief Complaint  Patient presents with  . Shortness of Breath    HPI Brent Collins is a 66 y.o. male.  HPI  patient has a history of COPD. He states March 30 he started having cough with yellow sputum production and feeling short of breath with wheezing. He states he ran out of his inhaler this week. He states he tried to go to the drugstore to get it refilled however he felt too bad. He has had some clear rhinorrhea without sneezing and denies sore throat, vomiting, or diarrhea. He is unaware fever and denies chills.  PCP Thalia Party Edilia Bo, MD (Inactive)   Past Medical History:  Diagnosis Date  . Asthma   . COPD (chronic obstructive pulmonary disease) (HCC)   . Inguinal hernia     Patient Active Problem List   Diagnosis Date Noted  . COPD with acute exacerbation (HCC) 07/03/2016    History reviewed. No pertinent surgical history.     Home Medications    Prior to Admission medications   Medication Sig Start Date End Date Taking? Authorizing Provider  albuterol (PROVENTIL HFA;VENTOLIN HFA) 108 (90 BASE) MCG/ACT inhaler Inhale 2 puffs into the lungs every 4 (four) hours as needed for wheezing or shortness of breath. 03/02/13   Azalia Bilis, MD  albuterol (PROVENTIL HFA;VENTOLIN HFA) 108 (90 BASE) MCG/ACT inhaler Inhale 1-2 puffs into the lungs every 6 (six) hours as needed for wheezing or shortness of breath. 05/10/13   Sunnie Nielsen, MD  Aspirin-Acetaminophen-Caffeine (GOODY HEADACHE PO) Take 1 packet by mouth as needed. For pain    Historical Provider, MD    Family History History reviewed. No pertinent family history.  Social History Social History  Substance Use Topics  . Smoking status: Never Smoker  . Smokeless tobacco: Never Used  . Alcohol use No  lives with his sister   Allergies   Tomato   Review of Systems Review  of Systems  All other systems reviewed and are negative.    Physical Exam Updated Vital Signs BP (!) 149/95   Pulse 80   Temp 98.4 F (36.9 C) (Oral)   Resp 20   Ht  (1.676 m)   Wt 140 lb (63.5 kg)   SpO2 100%   BMI 22.60 kg/m   Vital signs normal except hypertension   Physical Exam  Constitutional: He is oriented to person, place, and time.  Non-toxic appearance. He does not appear ill. No distress.  Thin male appears older than stated age  HENT:  Head: Normocephalic and atraumatic.  Right Ear: External ear normal.  Left Ear: External ear normal.  Nose: Nose normal. No mucosal edema or rhinorrhea.  Mouth/Throat: Oropharynx is clear and moist and mucous membranes are normal. No dental abscesses or uvula swelling.  Eyes: Conjunctivae and EOM are normal. Pupils are equal, round, and reactive to light.  Neck: Normal range of motion and full passive range of motion without pain. Neck supple.  Cardiovascular: Normal rate, regular rhythm and normal heart sounds.  Exam reveals no gallop and no friction rub.   No murmur heard. Pulmonary/Chest: Effort normal. No respiratory distress. He has decreased breath sounds. He has wheezes. He has no rhonchi. He has no rales. He exhibits no tenderness and no crepitus.  He has diffuse wheezing  Abdominal: Soft. Normal appearance and bowel sounds are  normal. He exhibits no distension. There is no tenderness. There is no rebound and no guarding.  Musculoskeletal: Normal range of motion. He exhibits no edema or tenderness.  Moves all extremities well.   Neurological: He is alert and oriented to person, place, and time. He has normal strength. No cranial nerve deficit.  Skin: Skin is warm, dry and intact. No rash noted. No erythema. No pallor.  Psychiatric: He has a normal mood and affect. His speech is normal and behavior is normal. His mood appears not anxious.  Nursing note and vitals reviewed.    ED Treatments / Results  Labs (all  labs ordered are listed, but only abnormal results are displayed) Results for orders placed or performed during the hospital encounter of 07/03/16  Comprehensive metabolic panel  Result Value Ref Range   Sodium 141 135 - 145 mmol/L   Potassium 4.1 3.5 - 5.1 mmol/L   Chloride 102 101 - 111 mmol/L   CO2 29 22 - 32 mmol/L   Glucose, Bld 133 (H) 65 - 99 mg/dL   BUN 24 (H) 6 - 20 mg/dL   Creatinine, Ser 1.61 0.61 - 1.24 mg/dL   Calcium 9.6 8.9 - 09.6 mg/dL   Total Protein 7.6 6.5 - 8.1 g/dL   Albumin 4.1 3.5 - 5.0 g/dL   AST 24 15 - 41 U/L   ALT 11 (L) 17 - 63 U/L   Alkaline Phosphatase 83 38 - 126 U/L   Total Bilirubin 1.0 0.3 - 1.2 mg/dL   GFR calc non Af Amer >60 >60 mL/min   GFR calc Af Amer >60 >60 mL/min   Anion gap 10 5 - 15  CBC with Differential  Result Value Ref Range   WBC 7.1 4.0 - 10.5 K/uL   RBC 5.20 4.22 - 5.81 MIL/uL   Hemoglobin 15.7 13.0 - 17.0 g/dL   HCT 04.5 40.9 - 81.1 %   MCV 90.4 78.0 - 100.0 fL   MCH 30.2 26.0 - 34.0 pg   MCHC 33.4 30.0 - 36.0 g/dL   RDW 91.4 78.2 - 95.6 %   Platelets 209 150 - 400 K/uL   Neutrophils Relative % 65 %   Neutro Abs 4.5 1.7 - 7.7 K/uL   Lymphocytes Relative 16 %   Lymphs Abs 1.2 0.7 - 4.0 K/uL   Monocytes Relative 10 %   Monocytes Absolute 0.7 0.1 - 1.0 K/uL   Eosinophils Relative 9 %   Eosinophils Absolute 0.6 0.0 - 0.7 K/uL   Basophils Relative 0 %   Basophils Absolute 0.0 0.0 - 0.1 K/uL   Laboratory interpretation all normal except hyperglycemia, elevated BUN    EKG  EKG Interpretation None       Radiology Dg Chest 2 View  Result Date: 07/03/2016 CLINICAL DATA:  Productive cough and shortness of breath for 3 days. History of asthma. EXAM: CHEST  2 VIEW COMPARISON:  Chest radiograph November 01, 2015 FINDINGS: Cardiomediastinal silhouette is normal, mildly calcified aortic knob. Increased lung volumes without pleural effusion or focal consolidation. No pneumothorax. Soft tissue planes and included osseous  structures are nonsuspicious. Severe RIGHT and moderate LEFT shoulder degenerative change. IMPRESSION: COPD without focal consolidation. Electronically Signed   By: Awilda Metro M.D.   On: 07/03/2016 03:22    Procedures Procedures (including critical care time)  CRITICAL CARE Performed by: Tobby Fawcett L Shown Dissinger Total critical care time: 40 minutes Critical care time was exclusive of separately billable procedures and treating other patients. Critical care was necessary  to treat or prevent imminent or life-threatening deterioration. Critical care was time spent personally by me on the following activities: development of treatment plan with patient and/or surrogate as well as nursing, discussions with consultants, evaluation of patient's response to treatment, examination of patient, obtaining history from patient or surrogate, ordering and performing treatments and interventions, ordering and review of laboratory studies, ordering and review of radiographic studies, pulse oximetry and re-evaluation of patient's condition.   Medications Ordered in ED Medications  cefTRIAXone (ROCEPHIN) 1 g in dextrose 5 % 50 mL IVPB (not administered)  azithromycin (ZITHROMAX) 500 mg in dextrose 5 % 250 mL IVPB (not administered)  ipratropium (ATROVENT) 0.02 % nebulizer solution (0.5 mg  Given 07/03/16 0446)  albuterol (PROVENTIL, VENTOLIN) (5 MG/ML) 0.5% continuous inhalation solution (10 mg/hr  Given 07/03/16 0445)  methylPREDNISolone sodium succinate (SOLU-MEDROL) 125 mg/2 mL injection 125 mg (125 mg Intravenous Given 07/03/16 0533)     Initial Impression / Assessment and Plan / ED Course  I have reviewed the triage vital signs and the nursing notes.  Pertinent labs & imaging results that were available during my care of the patient were reviewed by me and considered in my medical decision making (see chart for details).   Patient was started on continuous nebulizer. He was given IV steroids.  Recheck at 06:37  AM pt now has diffuse rhonchi and wheezing. He states he is feeling better. His pulse ox is 87-90 % on RA. He was started on [place] oxygen.  Will repeat his nebulizer, start on antibiotics and talk to hospitalist about admission.   Final Clinical Impressions(s) / ED Diagnoses   Final diagnoses:  COPD exacerbation [facility])    Plan admission  Devoria Albe, MD, Concha Pyo, MD 07/03/16 [phone]

## 2016-07-04 NOTE — Progress Notes (Signed)
PROGRESS NOTE    RENDER Brent Collins  ZOX:[id number] DOB: 1950-09-04 DOA: 07/03/2016 PCP: Alice Reichert, MD (Inactive)    Brief Narrative:  Brent Collins is a 66 y.o. male with medical history significant of with COPD,a sthma, inguinal hernia, came in for worsening sob, since 3 days, with productive cough, associated with wheezing. On arrival to ED, he was found to have wheezing, lab work up was  Not significant. CXR does not show any pneumonia. He was referred to medical service for admission for copd exacerbation.  Assessment & Plan:   Active Problems:   COPD with acute exacerbation (HCC)   Acute COPD exacerbation:  started on IV steroids, bronchodilators, robitussin, and doxycycline.  His breathing has improved. Recommend tapering down the steroids in am, ambulate in the hallway and check ambulating oxygen levels.  Wean him off the oxygen as appropriate.     DVT prophylaxis: lovenox.  Code Status: full code.  Family Communication: none at bedside.  Disposition Plan: pending further evaluation.    Consultants:   None.    Procedures: none.    Antimicrobials: doxycycline.    Subjective: Breathing has improved.   Objective: Vitals:   07/04/16 0847 07/04/16 1300 07/04/16 1409 07/04/16 1741  BP:  132/86  107/61  Pulse:  80  82  Resp:  20    Temp:  97.7 F (36.5 C)    TempSrc:  Oral    SpO2: 92% 95% 92% 100%  Weight:      Height:        Intake/Output Summary (Last 24 hours) at 07/04/16 1834 Last data filed at 07/04/16 1700  Gross per 24 hour  Intake          1741.25 ml  Output             1400 ml  Net           341.25 ml   Filed Weights   07/03/16 0229 07/03/16 1014  Weight: 63.5 kg (140 lb) 59 kg (130 lb)    Examination:  General exam: Appears calm and comfortable  Respiratory system: wheezing anteriorly.  Cardiovascular system: S1 & S2 heard, RRR. No JVD, murmurs, rubs, gallops or clicks. No pedal edema. Gastrointestinal system: Abdomen is  nondistended, soft and nontender. No organomegaly or masses felt. Normal bowel sounds heard. Central nervous system: Alert and oriented. No focal neurological deficits. Extremities: Symmetric 5 x 5 power. Skin: No rashes, lesions or ulcers Psychiatry: Judgement and insight appear normal. Mood & affect appropriate.     Data Reviewed: I have personally reviewed following labs and imaging studies  CBC:  Recent Labs Lab 07/03/16 0523  WBC 7.1  NEUTROABS 4.5  HGB 15.7  HCT 47.0  MCV 90.4  PLT 209   Basic Metabolic Panel:  Recent Labs Lab 07/03/16 0523  NA 141  K 4.1  CL 102  CO2 29  GLUCOSE 133*  BUN 24*  CREATININE 0.89  CALCIUM 9.6   GFR: Estimated Creatinine Clearance: 68.1 mL/min (by C-G formula based on SCr of 0.89 mg/dL). Liver Function Tests:  Recent Labs Lab 07/03/16 0523  AST 24  ALT 11*  ALKPHOS 83  BILITOT 1.0  PROT 7.6  ALBUMIN 4.1   No results for input(s): LIPASE, AMYLASE in the last 168 hours. No results for input(s): AMMONIA in the last 168 hours. Coagulation Profile: No results for input(s): INR, PROTIME in the last 168 hours. Cardiac Enzymes: No results for input(s): CKTOTAL, CKMB, CKMBINDEX, TROPONINI in the  last 168 hours. BNP (last 3 results) No results for input(s): PROBNP in the last 8760 hours. HbA1C: No results for input(s): HGBA1C in the last 72 hours. CBG: No results for input(s): GLUCAP in the last 168 hours. Lipid Profile: No results for input(s): CHOL, HDL, LDLCALC, TRIG, CHOLHDL, LDLDIRECT in the last 72 hours. Thyroid Function Tests: No results for input(s): TSH, T4TOTAL, FREET4, T3FREE, THYROIDAB in the last 72 hours. Anemia Panel: No results for input(s): VITAMINB12, FOLATE, FERRITIN, TIBC, IRON, RETICCTPCT in the last 72 hours. Sepsis Labs: No results for input(s): PROCALCITON, LATICACIDVEN in the last 168 hours.  No results found for this or any previous visit (from the past 240 hour(s)).       Radiology  Studies: Dg Chest 2 View  Result Date: 07/03/2016 CLINICAL DATA:  Productive cough and shortness of breath for 3 days. History of asthma. EXAM: CHEST  2 VIEW COMPARISON:  Chest radiograph November 01, 2015 FINDINGS: Cardiomediastinal silhouette is normal, mildly calcified aortic knob. Increased lung volumes without pleural effusion or focal consolidation. No pneumothorax. Soft tissue planes and included osseous structures are nonsuspicious. Severe RIGHT and moderate LEFT shoulder degenerative change. IMPRESSION: COPD without focal consolidation. Electronically Signed   By: Awilda Metro M.D.   On: 07/03/2016 03:22        Scheduled Meds: . doxycycline  100 mg Oral Q12H  . enoxaparin (LOVENOX) injection  40 mg Subcutaneous Q24H  . ipratropium-albuterol  3 mL Nebulization Q6H  . loratadine  10 mg Oral Daily  . methylPREDNISolone (SOLU-MEDROL) injection  60 mg Intravenous Q6H   Continuous Infusions: . sodium chloride 75 mL/hr at 07/04/16 1204     LOS: 1 day    Time spent: 30 minutes    Rayshad Riviello, MD [facility] Pager [phone]  If 7PM-7AM, please contact night-coverage [email or web address] Password [facility] 07/04/2016, 6:34 PM

## 2016-07-04 NOTE — Care Management Note (Addendum)
Case Management Note  Patient Details  Name: Brent Collins MRN: [id number] Date of Birth: March 11, 1951  Subjective/Objective:  Adm with COPD, from home with sister, ind with ADL's. No HH PTA. Currently on 2L, no home oxygen, does have neb machine.              Action/Plan: Anticipate DC home with self care. CM will follow for oxygen needs.   Addendum: 07/05/2016 Patient given list of providers accepting new patients. Per RN O2 assessment, patient does not qualify for oxygen. Currently on room air. No CM needs. Seen by PT, no recommendations.   Expected Discharge Date:     07/05/2016             Expected Discharge Plan:  Home/Self Care  In-House Referral:     Discharge planning Services  CM Consult  Post Acute Care Choice:    Choice offered to:     DME Arranged:    DME Agency:     HH Arranged:    HH Agency:     Status of Service:  In process, will continue to follow  If discussed at Long Length of Stay Meetings, dates discussed:    Additional Comments:  Mang Hazelrigg, Chrystine Oiler, RN 07/04/2016, 1:15 PM

## 2016-07-05 LAB — BRAIN NATRIURETIC PEPTIDE: B Natriuretic Peptide: 127 pg/mL — ABNORMAL HIGH (ref 0.0–100.0)

## 2016-07-05 MED ORDER — FUROSEMIDE 10 MG/ML IJ SOLN
40.0000 mg | Freq: Once | INTRAMUSCULAR | Status: AC
  Administered 2016-07-05: 40 mg via INTRAVENOUS
  Filled 2016-07-05: qty 4

## 2016-07-05 MED ORDER — METHYLPREDNISOLONE SODIUM SUCC 125 MG IJ SOLR
60.0000 mg | Freq: Two times a day (BID) | INTRAMUSCULAR | Status: DC
  Administered 2016-07-05 – 2016-07-06 (×3): 60 mg via INTRAVENOUS
  Filled 2016-07-05 (×2): qty 2

## 2016-07-05 MED ORDER — IPRATROPIUM-ALBUTEROL 0.5-2.5 (3) MG/3ML IN SOLN
3.0000 mL | Freq: Three times a day (TID) | RESPIRATORY_TRACT | Status: DC
  Administered 2016-07-06 – 2016-07-07 (×4): 3 mL via RESPIRATORY_TRACT
  Filled 2016-07-05 (×4): qty 3

## 2016-07-05 MED ORDER — ORAL CARE MOUTH RINSE
15.0000 mL | Freq: Two times a day (BID) | OROMUCOSAL | Status: DC
  Administered 2016-07-05 – 2016-07-06 (×2): 15 mL via OROMUCOSAL

## 2016-07-05 NOTE — Progress Notes (Signed)
PROGRESS NOTE                                                                                                                                                                                                             Patient Demographics:    Brent Collins, is a 66 y.o. male, DOB - December 15, 1950, ZOX:[id number]  Admit date - 07/03/2016   Admitting Physician Kathlen Mody, MD  Outpatient Primary MD for the patient is Alice Reichert, MD (Inactive)  LOS - 2  Chief Complaint  Patient presents with  . Shortness of Breath       Brief Narrative  Brent Collins a 66 y.o.malewith medical history significant of with COPD,a sthma, inguinal hernia, came in for worsening sob, since 3 days, with productive cough, associated with wheezing. On arrival to ED, he was found to have wheezing, lab work up was Not significant. CXR does not show any pneumonia. He was referred to medical service for admission for copd exacerbation.   Subjective:    Brent Collins today has, No headache, No chest pain, No abdominal pain - No Nausea, No new weakness tingling or numbness, No Cough - SOB.     Assessment  & Plan :      1.Acute hypoxic respiratory failure secondary to acute on chronic COPD exacerbation, cannot rule out CHF. Does have rales on exam and wheezing has minimized, cut down IV steroids and continue oral doxycycline, place on IV Lasix, check echocardiogram and BNP. Will qualify for home oxygen. Home nebulizer treatments also ordered.  2. Generalized weakness. PT eval increase activity.    Diet : Diet Heart Room service appropriate? Yes; Fluid consistency: Thin; Fluid restriction: 1500 mL Fluid    Family Communication  :  None  Code Status :  Full  Disposition Plan  :  Home in am  Consults  :  None  Procedures  :    TTE  DVT Prophylaxis  :  Lovenox    Lab Results  Component Value Date   PLT 209 07/03/2016     Inpatient Medications  Scheduled Meds: . doxycycline  100 mg Oral Q12H  . enoxaparin (LOVENOX) injection  40 mg Subcutaneous Q24H  . ipratropium-albuterol  3 mL Nebulization Q6H  . loratadine  10 mg Oral Daily  . mouth rinse  15 mL Mouth Rinse  BID  . methylPREDNISolone (SOLU-MEDROL) injection  60 mg Intravenous Q12H   Continuous Infusions: PRN Meds:.acetaminophen, guaiFENesin-dextromethorphan, ondansetron (ZOFRAN) IV, traMADol  Antibiotics  :    Anti-infectives    Start     Dose/Rate Route Frequency Ordered Stop   07/04/16 1000  doxycycline (VIBRA-TABS) tablet 100 mg     100 mg Oral Every 12 hours 07/03/16 1040     07/03/16 1045  doxycycline (VIBRA-TABS) tablet 100 mg  Status:  Discontinued     100 mg Oral Every 12 hours 07/03/16 1038 07/03/16 1040   07/03/16 0645  cefTRIAXone (ROCEPHIN) 1 g in dextrose 5 % 50 mL IVPB     1 g 100 mL/hr over 30 Minutes Intravenous  Once 07/03/16 0639 07/03/16 0744   07/03/16 0645  azithromycin (ZITHROMAX) 500 mg in dextrose 5 % 250 mL IVPB     500 mg 250 mL/hr over 60 Minutes Intravenous  Once 07/03/16 0639 07/03/16 0903         Objective:   Vitals:   07/04/16 2155 07/05/16 0112 07/05/16 0617 07/05/16 0827  BP: (!) 118/58  137/88   Pulse: 80  78   Resp: 20  20   Temp: 97.8 F (36.6 C)  98 F (36.7 C)   TempSrc: Oral  Oral   SpO2: 100% 95% 96% (!) 86%  Weight:      Height:        Wt Readings from Last 3 Encounters:  07/03/16 59 kg (130 lb)  05/10/13 68 kg (150 lb)  03/02/13 68 kg (150 lb)     Intake/Output Summary (Last 24 hours) at 07/05/16 0945 Last data filed at 07/05/16 0615  Gross per 24 hour  Intake             1500 ml  Output              600 ml  Net              900 ml     Physical Exam  Awake Alert, Oriented X 3, No new F.N deficits, Normal affect [place].AT,PERRAL Supple Neck,No JVD, No cervical lymphadenopathy appriciated.  Symmetrical Chest wall movement, Good air movement bilaterally, +ve rales and  wheezes RRR,No Gallops,Rubs or new Murmurs, No Parasternal Heave +ve B.Sounds, Abd Soft, No tenderness, No organomegaly appriciated, No rebound - guarding or rigidity. No Cyanosis, Clubbing or edema, No new Rash or bruise       Data Review:    CBC  Recent Labs Lab 07/03/16 0523  WBC 7.1  HGB 15.7  HCT 47.0  PLT 209  MCV 90.4  MCH 30.2  MCHC 33.4  RDW 13.9  LYMPHSABS 1.2  MONOABS 0.7  EOSABS 0.6  BASOSABS 0.0    Chemistries   Recent Labs Lab 07/03/16 0523  NA 141  K 4.1  CL 102  CO2 29  GLUCOSE 133*  BUN 24*  CREATININE 0.89  CALCIUM 9.6  AST 24  ALT 11*  ALKPHOS 83  BILITOT 1.0   ------------------------------------------------------------------------------------------------------------------ No results for input(s): CHOL, HDL, LDLCALC, TRIG, CHOLHDL, LDLDIRECT in the last 72 hours.  No results found for: HGBA1C ------------------------------------------------------------------------------------------------------------------ No results for input(s): TSH, T4TOTAL, T3FREE, THYROIDAB in the last 72 hours.  Invalid input(s): FREET3 ------------------------------------------------------------------------------------------------------------------ No results for input(s): VITAMINB12, FOLATE, FERRITIN, TIBC, IRON, RETICCTPCT in the last 72 hours.  Coagulation profile No results for input(s): INR, PROTIME in the last 168 hours.  No results for input(s): DDIMER in the last 72 hours.  Cardiac  Enzymes No results for input(s): CKMB, TROPONINI, MYOGLOBIN in the last 168 hours.  Invalid input(s): CK ------------------------------------------------------------------------------------------------------------------ No results found for: BNP  Micro Results No results found for this or any previous visit (from the past 240 hour(s)).  Radiology Reports Dg Chest 2 View  Result Date: 07/03/2016 CLINICAL DATA:  Productive cough and shortness of breath for 3 days.  History of asthma. EXAM: CHEST  2 VIEW COMPARISON:  Chest radiograph November 01, 2015 FINDINGS: Cardiomediastinal silhouette is normal, mildly calcified aortic knob. Increased lung volumes without pleural effusion or focal consolidation. No pneumothorax. Soft tissue planes and included osseous structures are nonsuspicious. Severe RIGHT and moderate LEFT shoulder degenerative change. IMPRESSION: COPD without focal consolidation. Electronically Signed   By: Awilda Metro M.D.   On: 07/03/2016 03:22    Time Spent in minutes  30   SINGH,PRASHANT K M.D on 07/05/2016 at 9:45 AM  Between 7am to 7pm - Pager - [phone] ( page via [facility], text pages only, please mention full 10 digit call back number).  After 7pm go to [email or web address] - password [facility]  [facility] -  Office  [phone]

## 2016-07-05 NOTE — Care Management Important Message (Signed)
Important Message  Patient Details  Name: Brent Collins MRN: [id number] Date of Birth: [birth year]   Medicare Important Message Given:  Yes    Sharika Mosquera, Chrystine Oiler, RN 07/05/2016, 1:39 PM

## 2016-07-05 NOTE — Evaluation (Signed)
Physical Therapy Evaluation Patient Details Name: Brent Collins MRN: [id number] DOB: June 14, 1950 Today's Date: 07/05/2016   History of Present Illness   66 y.o. male with medical history significant of with COPD,a sthma, inguinal hernia, came in for worsening sob, since 3 days, with productive cough, associated with wheezing. On arrival to ED, he was found to have wheezing, lab work up was  Not significant. CXR does not show any pneumonia. He was referred to medical service for admission for copd exacerbation.   Clinical Impression  Pt received in the room, just ambulating out of the bathroom, and is agreeable to PT evaluation.   He is normally independent with unlimited community ambulation, and independent with dressing and bathing.  During today's PT evaluation, he ambulated 322ft independently with no LOB, and no deficit noted with any functional mobility tasks.  SpO2 did desaturate to 86% on RA while ambulating, but improved to 95% quickly with seated rest and encouragement for deep purse lipped breathing. This pt does not demonstrate need for skilled PT, will sign off.     Follow Up Recommendations No PT follow up    Equipment Recommendations  None recommended by PT    Recommendations for Other Services       Precautions / Restrictions Precautions Precautions: None Restrictions Weight Bearing Restrictions: No      Mobility  Bed Mobility Overal bed mobility: Independent                Transfers Overall transfer level: Independent                  Ambulation/Gait Ambulation/Gait assistance: Independent Ambulation Distance (Feet): 300 Feet Assistive device: None Gait Pattern/deviations: WFL(Within Functional Limits)     General Gait Details: Excellent cadence with no LOB.  SpO2 noted to desaturate to 86% while on RA, and improved quickly to 95% with seated rest and cues for PLB.   Stairs            Wheelchair Mobility    Modified Rankin (Stroke  Patients Only)       Balance Overall balance assessment: Independent                                           Pertinent Vitals/Pain Pain Assessment: No/denies pain    Home Living   Living Arrangements: Other (Comment) (Sister's dtr and her boyfriend) Available Help at Discharge: Available PRN/intermittently Type of Home: Mobile home Home Access: Stairs to enter   Entrance Stairs-Number of Steps: 4 Home Layout: One level Home Equipment: None      Prior Function Level of Independence: Independent               Hand Dominance   Dominant Hand: Right    Extremity/Trunk Assessment                Communication   Communication: No difficulties  Cognition Arousal/Alertness: Awake/alert Behavior During Therapy: WFL for tasks assessed/performed Overall Cognitive Status: Within Functional Limits for tasks assessed                                        General Comments      Exercises     Assessment/Plan    PT Assessment Patent does not need any further PT services  PT Problem List         PT Treatment Interventions      PT Goals (Current goals can be found in the Care Plan section)  Acute Rehab PT Goals PT Goal Formulation: All assessment and education complete, DC therapy    Frequency     Barriers to discharge        Co-evaluation               End of Session Equipment Utilized During Treatment: Gait belt Activity Tolerance: Patient tolerated treatment well Patient left: with call bell/phone within reach (Sitting on the EOB) Nurse Communication: Mobility status (mobility sheet left in the room. ) PT Visit Diagnosis: Muscle weakness (generalized) (M62.81)    Time: 1316-1330 PT Time Calculation (min) (ACUTE ONLY): 14 min   Charges:   PT Evaluation $PT Eval Low Complexity: 1 Procedure     PT G Codes:   PT G-Codes **NOT FOR INPATIENT CLASS** Functional Assessment Tool Used: AM-PAC 6 Clicks  Basic Mobility Functional Limitation: Mobility: Walking and moving around Mobility: Walking and Moving Around Current Status (Z6109): 0 percent impaired, limited or restricted Mobility: Walking and Moving Around Goal Status (U0454): 0 percent impaired, limited or restricted Mobility: Walking and Moving Around Discharge Status (U9811): 0 percent impaired, limited or restricted    Beth Akosua Constantine, PT, DPT X: [phone]

## 2016-07-05 NOTE — Progress Notes (Signed)
SATURATION QUALIFICATIONS: (This note is used to comply with regulatory documentation for home oxygen)  Patient Saturations on Room Air at Rest = 94%  Patient Saturations on Room Air while Ambulating = 90-93%  Patient Saturations on 2 Liters of oxygen while Ambulating = 94%  Please briefly explain why patient needs home oxygen: Patient was ambulating with Nurse Tech and SATs flooded from 90-93% while ambulating.

## 2016-07-06 ENCOUNTER — Inpatient Hospital Stay (HOSPITAL_COMMUNITY): Payer: Medicare Other

## 2016-07-06 DIAGNOSIS — I509 Heart failure, unspecified: Secondary | ICD-10-CM

## 2016-07-06 LAB — BASIC METABOLIC PANEL
ANION GAP: 9 (ref 5–15)
BUN: 36 mg/dL — ABNORMAL HIGH (ref 6–20)
CALCIUM: 9.4 mg/dL (ref 8.9–10.3)
CO2: 32 mmol/L (ref 22–32)
Chloride: 102 mmol/L (ref 101–111)
Creatinine, Ser: 1.01 mg/dL (ref 0.61–1.24)
GFR calc Af Amer: >60 mL/min (ref >60)
Glucose, Bld: 249 mg/dL — ABNORMAL HIGH (ref 65–99)
POTASSIUM: 3.8 mmol/L (ref 3.5–5.1)
SODIUM: 143 mmol/L (ref 135–145)

## 2016-07-06 LAB — ECHOCARDIOGRAM COMPLETE
HEIGHTINCHES: 66 in
Weight: 2080 oz

## 2016-07-06 MED ORDER — POTASSIUM CHLORIDE CRYS ER 20 MEQ PO TBCR
40.0000 meq | EXTENDED_RELEASE_TABLET | Freq: Once | ORAL | Status: AC
  Administered 2016-07-06: 40 meq via ORAL
  Filled 2016-07-06: qty 2

## 2016-07-06 MED ORDER — FUROSEMIDE 10 MG/ML IJ SOLN
40.0000 mg | Freq: Once | INTRAMUSCULAR | Status: AC
  Administered 2016-07-06: 40 mg via INTRAVENOUS
  Filled 2016-07-06: qty 4

## 2016-07-06 MED ORDER — CARVEDILOL 3.125 MG PO TABS
3.1250 mg | ORAL_TABLET | Freq: Two times a day (BID) | ORAL | Status: DC
  Administered 2016-07-06 (×2): 3.125 mg via ORAL
  Filled 2016-07-06 (×2): qty 1

## 2016-07-06 MED ORDER — ISOSORBIDE MONONITRATE ER 60 MG PO TB24
30.0000 mg | ORAL_TABLET | Freq: Every day | ORAL | Status: DC
  Administered 2016-07-06 – 2016-07-07 (×2): 30 mg via ORAL
  Filled 2016-07-06 (×2): qty 1

## 2016-07-06 MED ORDER — METHYLPREDNISOLONE SODIUM SUCC 125 MG IJ SOLR: 60.0000 mg | Freq: Every day | INTRAMUSCULAR | Status: DC

## 2016-07-06 NOTE — Progress Notes (Addendum)
PROGRESS NOTE                                                                                                                                                                                                             Patient Demographics:    Brent Collins, is a 66 y.o. male, DOB - [birth year], ZOX:[id number]  Admit date - 07/03/2016   Admitting Physician Kathlen Mody, MD  Outpatient Primary MD for the patient is Alice Reichert, MD (Inactive)  LOS - 3  Chief Complaint  Patient presents with  . Shortness of Breath       Brief Narrative  Brent Collins a 66 y.o.malewith medical history significant of with COPD,a sthma, inguinal hernia, came in for worsening sob, since 3 days, with productive cough, associated with wheezing. On arrival to ED, he was found to have wheezing, lab work up was Not significant. CXR does not show any pneumonia. He was referred to medical service for admission for copd exacerbation & CHF.   Subjective:    Brent Collins today has, No headache, No chest pain, No abdominal pain - No Nausea, No new weakness tingling or numbness, No Cough - better SOB.     Assessment  & Plan :      1.Acute hypoxic respiratory failure secondary to acute on chronic COPD exacerbation, cannot rule out CHF. Does have rales on exam and wheezing has minimized, cut down IV steroids and continue oral doxycycline, place on IV Lasix, check echocardiogram which is pending, elevated BNP. Better with diuresis, which will be continued + Coreg and Imdur added. Home nebulizer treatments also ordered.  2. Generalized weakness. PT eval increase activity.  3.HTN - placed on Coreg and Imdur.    Diet : Diet Heart Room service appropriate? Yes; Fluid consistency: Thin; Fluid restriction: 1500 mL Fluid    Family Communication  :  None  Code Status :  Full  Disposition Plan  :  Home 07-07-16  Consults  :  None  Procedures  :     TTE  DVT Prophylaxis  :  Lovenox    Lab Results  Component Value Date   PLT 209 07/03/2016    Inpatient Medications  Scheduled Meds: . doxycycline  100 mg Oral Q12H  . enoxaparin (LOVENOX) injection  40 mg Subcutaneous Q24H  . furosemide  40  mg Intravenous Once  . ipratropium-albuterol  3 mL Nebulization TID  . loratadine  10 mg Oral Daily  . mouth rinse  15 mL Mouth Rinse BID  . [START ON 07/07/2016] methylPREDNISolone (SOLU-MEDROL) injection  60 mg Intravenous Daily  . potassium chloride  40 mEq Oral Once   Continuous Infusions: PRN Meds:.acetaminophen, guaiFENesin-dextromethorphan, ondansetron (ZOFRAN) IV, traMADol  Antibiotics  :    Anti-infectives    Start     Dose/Rate Route Frequency Ordered Stop   07/04/16 1000  doxycycline (VIBRA-TABS) tablet 100 mg     100 mg Oral Every 12 hours 07/03/16 1040     07/03/16 1045  doxycycline (VIBRA-TABS) tablet 100 mg  Status:  Discontinued     100 mg Oral Every 12 hours 07/03/16 1038 07/03/16 1040   07/03/16 0645  cefTRIAXone (ROCEPHIN) 1 g in dextrose 5 % 50 mL IVPB     1 g 100 mL/hr over 30 Minutes Intravenous  Once 07/03/16 0639 07/03/16 0744   07/03/16 0645  azithromycin (ZITHROMAX) 500 mg in dextrose 5 % 250 mL IVPB     500 mg 250 mL/hr over 60 Minutes Intravenous  Once 07/03/16 0639 07/03/16 0903         Objective:   Vitals:   07/05/16 1934 07/05/16 2117 07/06/16 0512 07/06/16 0818  BP:  (!) 161/90 (!) 159/102   Pulse:  92 100   Resp:  18 18   Temp:  98.3 F (36.8 C) 98.6 F (37 C)   TempSrc:  Oral Oral   SpO2: 92% 93% 98% 98%  Weight:      Height:        Wt Readings from Last 3 Encounters:  07/03/16 59 kg (130 lb)  05/10/13 68 kg (150 lb)  03/02/13 68 kg (150 lb)     Intake/Output Summary (Last 24 hours) at 07/06/16 0833 Last data filed at 07/06/16 0400  Gross per 24 hour  Intake              840 ml  Output                0 ml  Net              840 ml     Physical Exam  Awake Alert,  Oriented X 3, No new F.N deficits, Normal affect [place].AT,PERRAL Supple Neck,No JVD, No cervical lymphadenopathy appriciated.  Symmetrical Chest wall movement, Good air movement bilaterally, +ve rales and wheezes RRR,No Gallops,Rubs or new Murmurs, No Parasternal Heave +ve B.Sounds, Abd Soft, No tenderness, No organomegaly appriciated, No rebound - guarding or rigidity. No Cyanosis, Clubbing or edema, No new Rash or bruise       Data Review:    CBC  Recent Labs Lab 07/03/16 0523  WBC 7.1  HGB 15.7  HCT 47.0  PLT 209  MCV 90.4  MCH 30.2  MCHC 33.4  RDW 13.9  LYMPHSABS 1.2  MONOABS 0.7  EOSABS 0.6  BASOSABS 0.0    Chemistries   Recent Labs Lab 07/03/16 0523 07/06/16 0433  NA 141 143  K 4.1 3.8  CL 102 102  CO2 29 32  GLUCOSE 133* 249*  BUN 24* 36*  CREATININE 0.89 1.01  CALCIUM 9.6 9.4  AST 24  --   ALT 11*  --   ALKPHOS 83  --   BILITOT 1.0  --    ------------------------------------------------------------------------------------------------------------------ No results for input(s): CHOL, HDL, LDLCALC, TRIG, CHOLHDL, LDLDIRECT in the last 72 hours.  No  results found for: HGBA1C ------------------------------------------------------------------------------------------------------------------ No results for input(s): TSH, T4TOTAL, T3FREE, THYROIDAB in the last 72 hours.  Invalid input(s): FREET3 ------------------------------------------------------------------------------------------------------------------ No results for input(s): VITAMINB12, FOLATE, FERRITIN, TIBC, IRON, RETICCTPCT in the last 72 hours.  Coagulation profile No results for input(s): INR, PROTIME in the last 168 hours.  No results for input(s): DDIMER in the last 72 hours.  Cardiac Enzymes No results for input(s): CKMB, TROPONINI, MYOGLOBIN in the last 168 hours.  Invalid input(s):  CK ------------------------------------------------------------------------------------------------------------------    Component Value Date/Time   BNP 127.0 (H) 07/05/2016 6045    Micro Results No results found for this or any previous visit (from the past 240 hour(s)).  Radiology Reports Dg Chest 2 View  Result Date: 07/03/2016 CLINICAL DATA:  Productive cough and shortness of breath for 3 days. History of asthma. EXAM: CHEST  2 VIEW COMPARISON:  Chest radiograph November 01, 2015 FINDINGS: Cardiomediastinal silhouette is normal, mildly calcified aortic knob. Increased lung volumes without pleural effusion or focal consolidation. No pneumothorax. Soft tissue planes and included osseous structures are nonsuspicious. Severe RIGHT and moderate LEFT shoulder degenerative change. IMPRESSION: COPD without focal consolidation. Electronically Signed   By: Awilda Metro M.D.   On: 07/03/2016 03:22   Dg Chest Port 1 View  Result Date: 07/06/2016 CLINICAL DATA:  Productive cough and shortness of breath. History of asthma -COPD. Nonsmoker. EXAM: PORTABLE CHEST 1 VIEW COMPARISON:  Chest x-ray of July 03, 2016 FINDINGS: There have developed increased interstitial markings in both lungs greatest on the left. There is no alveolar infiltrate or pleural effusion. The heart and pulmonary vascularity are normal. The mediastinum is normal in width. There is calcification in the wall of the aortic arch. There is moderate degenerative change of both shoulders. IMPRESSION: Interval worsening of the pulmonary interstitium likely reflecting worsening interstitial pneumonia or less likely interstitial edema. Underlying changes of reactive airway disease. Thoracic aortic atherosclerosis. Electronically Signed   By: David  [place] M.D.   On: 07/06/2016 07:16    Time Spent in minutes  30   Lyza Houseworth K M.D on 07/06/2016 at 8:33 AM  Between 7am to 7pm - Pager - [phone] ( page via [facility], text pages only, please  mention full 10 digit call back number).  After 7pm go to [email or web address] - password [facility]  [facility] -  Office  [phone]

## 2016-07-06 NOTE — Progress Notes (Addendum)
Patient has been sitting in chair part of the day and been alternating from bed to chair PRN throughout day. Tolerates well.

## 2016-07-06 NOTE — Progress Notes (Signed)
Inpatient Diabetes Program Recommendations  AACE/ADA: New Consensus Statement on Inpatient Glycemic Control (2015)  Target Ranges:  Prepandial:   less than 140 mg/dL      Peak postprandial:   less than 180 mg/dL (1-2 hours)      Critically ill patients:  140 - 180 mg/dL   Results for KAEDYN, POLIVKA (MRN [id number]) as of 07/06/2016 13:37  Ref. Range 07/03/2016 05:23 07/06/2016 04:33  Glucose Latest Ref Range: 65 - 99 mg/dL 409 (H) 811 (H)    Review of Glycemic Control  Diabetes history: No Outpatient Diabetes medications: NA Current orders for Inpatient glycemic control: None  Inpatient Diabetes Program Recommendations: Correction (SSI): While inpatient and ordered steroids, please consider ordering CBGs with Novolog correction scale. HgbA1C: Please consider ordering an A1C to evaluate glycemic control over the past 2-3 months.  Thanks, Orlando Penner, RN, MSN, CDE Diabetes Coordinator Inpatient Diabetes Program [phone] (Team Pager from 8am to 5pm)

## 2016-07-06 NOTE — Progress Notes (Signed)
*  PRELIMINARY RESULTS* Echocardiogram 2D Echocardiogram has been performed.  Stacey Drain 07/06/2016, 3:32 PM

## 2016-07-07 MED ORDER — CARVEDILOL 6.25 MG PO TABS: 6.2500 mg | ORAL_TABLET | Freq: Two times a day (BID) | ORAL | 0 refills | Status: DC

## 2016-07-07 MED ORDER — FUROSEMIDE 10 MG/ML IJ SOLN: 60.0000 mg | Freq: Once | INTRAMUSCULAR | Status: DC

## 2016-07-07 MED ORDER — POTASSIUM CHLORIDE CRYS ER 20 MEQ PO TBCR
20.0000 meq | EXTENDED_RELEASE_TABLET | Freq: Once | ORAL | Status: AC
  Administered 2016-07-07: 20 meq via ORAL
  Filled 2016-07-07: qty 1

## 2016-07-07 MED ORDER — PREDNISONE 5 MG PO TABS: ORAL_TABLET | ORAL | 0 refills | Status: DC

## 2016-07-07 MED ORDER — ISOSORBIDE MONONITRATE ER 30 MG PO TB24: 30.0000 mg | ORAL_TABLET | Freq: Every day | ORAL | 0 refills | Status: DC

## 2016-07-07 MED ORDER — POTASSIUM CHLORIDE CRYS ER 20 MEQ PO TBCR: 20.0000 meq | EXTENDED_RELEASE_TABLET | Freq: Every day | ORAL | 0 refills | Status: DC

## 2016-07-07 MED ORDER — IPRATROPIUM-ALBUTEROL 0.5-2.5 (3) MG/3ML IN SOLN: 3.0000 mL | RESPIRATORY_TRACT | 0 refills | Status: DC | PRN

## 2016-07-07 MED ORDER — AMLODIPINE BESYLATE 5 MG PO TABS
10.0000 mg | ORAL_TABLET | Freq: Every day | ORAL | Status: DC
  Administered 2016-07-07: 10 mg via ORAL
  Filled 2016-07-07: qty 2

## 2016-07-07 MED ORDER — CARVEDILOL 3.125 MG PO TABS
6.2500 mg | ORAL_TABLET | Freq: Two times a day (BID) | ORAL | Status: DC
Start: 2016-07-07 — End: 2016-07-07
  Administered 2016-07-07: 6.25 mg via ORAL
  Filled 2016-07-07: qty 2

## 2016-07-07 MED ORDER — FUROSEMIDE 40 MG PO TABS: 40.0000 mg | ORAL_TABLET | Freq: Every day | ORAL | 0 refills | Status: DC

## 2016-07-07 MED ORDER — AMLODIPINE BESYLATE 10 MG PO TABS: 10.0000 mg | ORAL_TABLET | Freq: Every day | ORAL | 0 refills | Status: DC

## 2016-07-07 NOTE — Discharge Instructions (Signed)
Follow with Primary MD Alice Reichert, MD (Inactive) in 7 days   Get CBC, CMP, 2 view Chest X ray checked  by Primary MD or SNF MD in 5-7 days ( we routinely change or add medications that can affect your baseline labs and fluid status, therefore we recommend that you get the mentioned basic workup next visit with your PCP, your PCP may decide not to get them or add new tests based on their clinical decision)  Activity: As tolerated with Full fall precautions use walker/cane & assistance as needed  Disposition Home    Diet:  Heart Healthy  - Check your Weight same time everyday, if you gain over 2 pounds, or you develop in leg swelling, experience more shortness of breath or chest pain, call your Primary MD immediately. Follow Cardiac Low Salt Diet and 1.5 lit/day fluid restriction.  On your next visit with your primary care physician please Get Medicines reviewed and adjusted.  Please request your Prim.MD to go over all Hospital Tests and Procedure/Radiological results at the follow up, please get all Hospital records sent to your Prim MD by signing hospital release before you go home.  If you experience worsening of your admission symptoms, develop shortness of breath, life threatening emergency, suicidal or homicidal thoughts you must seek medical attention immediately by calling 911 or calling your MD immediately  if symptoms less severe.  You Must read complete instructions/literature along with all the possible adverse reactions/side effects for all the Medicines you take and that have been prescribed to you. Take any new Medicines after you have completely understood and accpet all the possible adverse reactions/side effects.   Do not drive, operate heavy machinery, perform activities at heights, swimming or participation in water activities or provide baby sitting services if your were admitted for syncope or siezures until you have seen by Primary MD or a Neurologist and advised to do so  again.  Do not drive when taking Pain medications.    Do not take more than prescribed Pain, Sleep and Anxiety Medications  Special Instructions: If you have smoked or chewed Tobacco  in the last 2 yrs please stop smoking, stop any regular Alcohol  and or any Recreational drug use.  Wear Seat belts while driving.   Please note  You were cared for by a hospitalist during your hospital stay. If you have any questions about your discharge medications or the care you received while you were in the hospital after you are discharged, you can call the unit and asked to speak with the hospitalist on call if the hospitalist that took care of you is not available. Once you are discharged, your primary care physician will handle any further medical issues. Please note that NO REFILLS for any discharge medications will be authorized once you are discharged, as it is imperative that you return to your primary care physician (or establish a relationship with a primary care physician if you do not have one) for your aftercare needs so that they can reassess your need for medications and monitor your lab values.

## 2016-07-07 NOTE — Discharge Summary (Addendum)
Brent Collins ZOX:[id number] DOB: [birth year] DOA: 07/03/2016  PCP: Alice Reichert, MD (Inactive)  Admit date: 07/03/2016  Discharge date: 07/07/2016  Admitted From: Home   Disposition:  Home   Recommendations for Outpatient Follow-up:   Follow up with PCP in 1-2 weeks  PCP Please obtain BMP/CBC, 2 view CXR in 1week,  (see Discharge instructions)   PCP Please follow up on the following pending results: Monitor BMP   Home Health: None Equipment/Devices: None  Consultations: None Discharge Condition: Stable   CODE STATUS: Full   Diet Recommendation: Diet Heart Healthy, Fluid restriction: 1500 mL Fluid/day   Chief Complaint  Patient presents with  . Shortness of Breath     Brief history of present illness from the day of admission and additional interim summary    Brent Pender Hudsonis a 66 y.o.malewith y.o.malewith medical history significant of with COPD,a sthma, inguinal hernia, came in for worsening sob, since 3 days, with productive cough, associated with wheezing. On arrival to ED, he was found to have wheezing, lab work up was Not significant. CXR does not show any pneumonia. He was referred to medical service for admission for copd exacerbation.                                                                 Hospital Course    1. Acute on chronic diastolic CHF EF 60%. Was diuresed with IV Lasix, placed on appropriate medications with good effect, echocardiogram was obtained, he is now off oxygen and symptom free, ambulated in the hallway without any oxygen need. We be discharged on Coreg, hydralazine, Imdur and Lasix combination. It was PCP to monitor weight and BMP closely. Outpatient cardiology follow-up.  2. Possible underlying undiagnosed COPD. There was a question of mild exacerbation, no wheezing on exam, did  get IV steroids initially therefore will put on oral steroid taper, has needed nebulizer treatments. PCP to consider outpatient PFTs.   Discharge diagnosis     Active Problems:   COPD exacerbation [facility])    Discharge instructions    Discharge Instructions    Discharge instructions    Complete by:  As directed    Follow with Primary MD Alice Reichert, MD (Inactive) in 7 days   Get CBC, CMP, 2 view Chest X ray checked  by Primary MD or SNF MD in 5-7 days ( we routinely change or add medications that can affect your baseline labs and fluid status, therefore we recommend that you get the mentioned basic workup next visit with your PCP, your PCP may decide not to get them or add new tests based on their clinical decision)  Activity: As tolerated with Full fall precautions use walker/cane & assistance as needed  Disposition Home    Diet:  Heart Healthy  - Check your  Weight same time everyday, if you gain over 2 pounds, or you develop in leg swelling, experience more shortness of breath or chest pain, call your Primary MD immediately. Follow Cardiac Low Salt Diet and 1.5 lit/day fluid restriction.  On your next visit with your primary care physician please Get Medicines reviewed and adjusted.  Please request your Prim.MD to go over all Hospital Tests and Procedure/Radiological results at the follow up, please get all Hospital records sent to your Prim MD by signing hospital release before you go home.  If you experience worsening of your admission symptoms, develop shortness of breath, life threatening emergency, suicidal or homicidal thoughts you must seek medical attention immediately by calling 911 or calling your MD immediately  if symptoms less severe.  You Must read complete instructions/literature along with all the possible adverse reactions/side effects for all the Medicines you take and that have been prescribed to you. Take any new Medicines after you have completely understood  and accpet all the possible adverse reactions/side effects.   Do not drive, operate heavy machinery, perform activities at heights, swimming or participation in water activities or provide baby sitting services if your were admitted for syncope or siezures until you have seen by Primary MD or a Neurologist and advised to do so again.  Do not drive when taking Pain medications.    Do not take more than prescribed Pain, Sleep and Anxiety Medications  Special Instructions: If you have smoked or chewed Tobacco  in the last 2 yrs please stop smoking, stop any regular Alcohol  and or any Recreational drug use.  Wear Seat belts while driving.   Please note  You were cared for by a hospitalist during your hospital stay. If you have any questions about your discharge medications or the care you received while you were in the hospital after you are discharged, you can call the unit and asked to speak with the hospitalist on call if the hospitalist that took care of you is not available. Once you are discharged, your primary care physician will handle any further medical issues. Please note that NO REFILLS for any discharge medications will be authorized once you are discharged, as it is imperative that you return to your primary care physician (or establish a relationship with a primary care physician if you do not have one) for your aftercare needs so that they can reassess your need for medications and monitor your lab values.   Increase activity slowly    Complete by:  As directed       Discharge Medications   Allergies as of 07/07/2016      Reactions   Tomato Shortness Of Breath, Nausea And Vomiting      Medication List    TAKE these medications   albuterol 108 (90 Base) MCG/ACT inhaler Commonly known as:  PROVENTIL HFA;VENTOLIN HFA Inhale 2 puffs into the lungs every 4 (four) hours as needed for wheezing or shortness of breath.   amLODipine 10 MG tablet Commonly known as:  NORVASC Take  1 tablet (10 mg total) by mouth daily.   carvedilol 6.25 MG tablet Commonly known as:  COREG Take 1 tablet (6.25 mg total) by mouth 2 (two) times daily with a meal.   furosemide 40 MG tablet Commonly known as:  LASIX Take 1 tablet (40 mg total) by mouth daily.   isosorbide mononitrate 30 MG 24 hr tablet Commonly known as:  IMDUR Take 1 tablet (30 mg total) by mouth daily.  potassium chloride SA 20 MEQ tablet Commonly known as:  K-DUR,KLOR-CON Take 1 tablet (20 mEq total) by mouth daily.   predniSONE 5 MG tablet Commonly known as:  DELTASONE Label  & dispense according to the schedule below. take 8 Pills PO for 3 days, 6 Pills PO for 3 days, 4 Pills PO for 3 days, 2 Pills PO for 3 days, 1 Pills PO for 3 days, 1/2 Pill  PO for 3 days then STOP. Total 65 pills.            Durable Medical Equipment        Start     Ordered   07/05/16 [phone]  For home use only DME Nebulizer/meds  Once    Question:  Patient needs a nebulizer to treat with the following condition  Answer:  COPD (chronic obstructive pulmonary disease) (HCC)   07/05/16 9604   07/05/16 0859  For home use only DME oxygen  Once    Question Answer Comment  Mode or (Route) Nasal cannula   Liters per Minute 2   Frequency Continuous (stationary and portable oxygen unit needed)   Oxygen conserving device Yes   Oxygen delivery system Gas      07/05/16 0858      Follow-up Information    Angus Edilia Bo, MD. Schedule an appointment as soon as possible for a visit in 1 week(s).   Specialty:  Family Medicine       Prentice Docker, MD. Schedule an appointment as soon as possible for a visit in 2 week(s).   Specialty:  Cardiology Why:  CHF Contact information: [address] [place] [address] [phone]           Major procedures and Radiology Reports - PLEASE review detailed and final reports thoroughly  -      TTE  - Left ventricle: The cavity size was normal. Wall thickness was normal.  Systolic function was normal. The estimated ejection fraction was in the range of 60% to 65%. Wall motion was normal; there were no regional wall motion abnormalities. Doppler parameters are consistent with abnormal left ventricular relaxation (grade 1 diastolic dysfunction). - Aortic valve: There was mild regurgitation. - Atrial septum: No defect or patent foramen ovale was identified.   Dg Chest 2 View  Result Date: 07/03/2016 CLINICAL DATA:  Productive cough and shortness of breath for 3 days. History of asthma. EXAM: CHEST  2 VIEW COMPARISON:  Chest radiograph November 01, 2015 FINDINGS: Cardiomediastinal silhouette is normal, mildly calcified aortic knob. Increased lung volumes without pleural effusion or focal consolidation. No pneumothorax. Soft tissue planes and included osseous structures are nonsuspicious. Severe RIGHT and moderate LEFT shoulder degenerative change. IMPRESSION: COPD without focal consolidation. Electronically Signed   By: Awilda Metro M.D.   On: 07/03/2016 03:22   Dg Chest Port 1 View  Result Date: 07/06/2016 CLINICAL DATA:  Productive cough and shortness of breath. History of asthma -COPD. Nonsmoker. EXAM: PORTABLE CHEST 1 VIEW COMPARISON:  Chest x-ray of July 03, 2016 FINDINGS: There have developed increased interstitial markings in both lungs greatest on the left. There is no alveolar infiltrate or pleural effusion. The heart and pulmonary vascularity are normal. The mediastinum is normal in width. There is calcification in the wall of the aortic arch. There is moderate degenerative change of both shoulders. IMPRESSION: Interval worsening of the pulmonary interstitium likely reflecting worsening interstitial pneumonia or less likely interstitial edema. Underlying changes of reactive airway disease. Thoracic aortic atherosclerosis. Electronically  Signed   By: David  [place] M.D.   On: 07/06/2016 07:16    Micro Results     No results found for this or any previous visit  (from the past 240 hour(s)).  Today   Subjective    Brent Collins today has no headache,no chest abdominal pain,no new weakness tingling or numbness, feels much better wants to go home today.     Objective   Blood pressure (!) 143/105, pulse 68, temperature 97.6 F (36.4 C), temperature source Oral, resp. rate 18, height  (1.676 m), weight 59 kg (130 lb), SpO2 93 %.   Intake/Output Summary (Last 24 hours) at 07/07/16 0904 Last data filed at 07/07/16 0700  Gross per 24 hour  Intake              480 ml  Output                0 ml  Net              480 ml    Exam Awake Alert, Oriented x 3, No new F.N deficits, Normal affect [place].AT,PERRAL Supple Neck,No JVD, No cervical lymphadenopathy appriciated.  Symmetrical Chest wall movement, Good air movement bilaterally, minimal rales RRR,No Gallops,Rubs or new Murmurs, No Parasternal Heave +ve B.Sounds, Abd Soft, Non tender, No organomegaly appriciated, No rebound -guarding or rigidity. No Cyanosis, Clubbing or edema, No new Rash or bruise   Data Review   CBC w Diff: Lab Results  Component Value Date   WBC 7.1 07/03/2016   HGB 15.7 07/03/2016   HCT 47.0 07/03/2016   PLT 209 07/03/2016   LYMPHOPCT 16 07/03/2016   MONOPCT 10 07/03/2016   EOSPCT 9 07/03/2016   BASOPCT 0 07/03/2016    CMP: Lab Results  Component Value Date   NA 143 07/06/2016   K 3.8 07/06/2016   CL 102 07/06/2016   CO2 32 07/06/2016   BUN 36 (H) 07/06/2016   CREATININE 1.01 07/06/2016   PROT 7.6 07/03/2016   ALBUMIN 4.1 07/03/2016   BILITOT 1.0 07/03/2016   ALKPHOS 83 07/03/2016   AST 24 07/03/2016   ALT 11 (L) 07/03/2016  .   Total Time in preparing paper work, data evaluation and todays exam - 35 minutes  Susa Raring M.D on 07/07/2016 at 9:04 AM  [facility]   Office  [phone]

## 2016-07-11 ENCOUNTER — Encounter: Payer: Self-pay | Admitting: Cardiovascular Disease

## 2016-07-11 ENCOUNTER — Encounter: Payer: Medicare Other | Admitting: Cardiovascular Disease

## 2017-02-18 ENCOUNTER — Other Ambulatory Visit: Payer: Self-pay

## 2017-02-18 ENCOUNTER — Inpatient Hospital Stay (HOSPITAL_COMMUNITY)
Admission: EM | Admit: 2017-02-18 | Discharge: 2017-02-19 | DRG: 190 | Disposition: A | Discharge status: Home / Self Care | Payer: Medicare Other | Attending: Internal Medicine | Admitting: Internal Medicine

## 2017-02-18 ENCOUNTER — Emergency Department (HOSPITAL_COMMUNITY): Payer: Medicare Other

## 2017-02-18 ENCOUNTER — Encounter (HOSPITAL_COMMUNITY): Payer: Self-pay | Admitting: Internal Medicine

## 2017-02-18 DIAGNOSIS — J440 Chronic obstructive pulmonary disease with acute lower respiratory infection: Principal | ICD-10-CM | POA: Diagnosis present

## 2017-02-18 DIAGNOSIS — Z23 Encounter for immunization: Secondary | ICD-10-CM | POA: Diagnosis not present

## 2017-02-18 DIAGNOSIS — Z79899 Other long term (current) drug therapy: Secondary | ICD-10-CM | POA: Diagnosis not present

## 2017-02-18 DIAGNOSIS — J441 Chronic obstructive pulmonary disease with (acute) exacerbation: Secondary | ICD-10-CM

## 2017-02-18 DIAGNOSIS — I519 Heart disease, unspecified: Secondary | ICD-10-CM

## 2017-02-18 DIAGNOSIS — E860 Dehydration: Secondary | ICD-10-CM

## 2017-02-18 DIAGNOSIS — R944 Abnormal results of kidney function studies: Secondary | ICD-10-CM | POA: Diagnosis not present

## 2017-02-18 DIAGNOSIS — I5189 Other ill-defined heart diseases: Secondary | ICD-10-CM | POA: Diagnosis present

## 2017-02-18 DIAGNOSIS — J209 Acute bronchitis, unspecified: Secondary | ICD-10-CM | POA: Diagnosis not present

## 2017-02-18 DIAGNOSIS — J9601 Acute respiratory failure with hypoxia: Secondary | ICD-10-CM | POA: Diagnosis present

## 2017-02-18 DIAGNOSIS — I10 Essential (primary) hypertension: Secondary | ICD-10-CM

## 2017-02-18 DIAGNOSIS — Z7952 Long term (current) use of systemic steroids: Secondary | ICD-10-CM

## 2017-02-18 DIAGNOSIS — R0902 Hypoxemia: Secondary | ICD-10-CM | POA: Diagnosis not present

## 2017-02-18 LAB — CBC WITH DIFFERENTIAL/PLATELET
BASOS ABS: 0.1 10*3/uL (ref 0.0–0.1)
Basophils Relative: 1 %
EOS PCT: 8 %
Eosinophils Absolute: 0.7 10*3/uL (ref 0.0–0.7)
HEMATOCRIT: 48.4 % (ref 39.0–52.0)
Hemoglobin: 16 g/dL (ref 13.0–17.0)
LYMPHS ABS: 1.5 10*3/uL (ref 0.7–4.0)
LYMPHS PCT: 18 %
MCH: 30.4 pg (ref 26.0–34.0)
MCHC: 33.1 g/dL (ref 30.0–36.0)
MCV: 92 fL (ref 78.0–100.0)
MONO ABS: 0.7 10*3/uL (ref 0.1–1.0)
Monocytes Relative: 8 %
NEUTROS ABS: 5.6 10*3/uL (ref 1.7–7.7)
Neutrophils Relative %: 65 %
PLATELETS: 245 10*3/uL (ref 150–400)
RBC: 5.26 MIL/uL (ref 4.22–5.81)
RDW: 14.5 % (ref 11.5–15.5)
WBC: 8.5 10*3/uL (ref 4.0–10.5)

## 2017-02-18 LAB — COMPREHENSIVE METABOLIC PANEL
ALT: 19 U/L (ref 17–63)
AST: 25 U/L (ref 15–41)
Albumin: 4.2 g/dL (ref 3.5–5.0)
Alkaline Phosphatase: 86 U/L (ref 38–126)
Anion gap: 8 (ref 5–15)
BUN: 22 mg/dL — AB (ref 6–20)
CHLORIDE: 105 mmol/L (ref 101–111)
CO2: 29 mmol/L (ref 22–32)
CREATININE: 0.87 mg/dL (ref 0.61–1.24)
Calcium: 9.3 mg/dL (ref 8.9–10.3)
Glucose, Bld: 114 mg/dL — ABNORMAL HIGH (ref 65–99)
Potassium: 4.5 mmol/L (ref 3.5–5.1)
Sodium: 142 mmol/L (ref 135–145)
TOTAL PROTEIN: 7.5 g/dL (ref 6.5–8.1)
Total Bilirubin: 1 mg/dL (ref 0.3–1.2)

## 2017-02-18 MED ORDER — SODIUM CHLORIDE 0.9 % IV SOLN
INTRAVENOUS | Status: DC
  Administered 2017-02-18: 22:00:00 via INTRAVENOUS

## 2017-02-18 MED ORDER — IPRATROPIUM-ALBUTEROL 0.5-2.5 (3) MG/3ML IN SOLN
3.0000 mL | Freq: Four times a day (QID) | RESPIRATORY_TRACT | Status: DC
  Administered 2017-02-19 (×2): 3 mL via RESPIRATORY_TRACT
  Filled 2017-02-18 (×2): qty 3

## 2017-02-18 MED ORDER — ACETAMINOPHEN 325 MG PO TABS: 650.0000 mg | ORAL_TABLET | Freq: Four times a day (QID) | ORAL | Status: DC | PRN

## 2017-02-18 MED ORDER — ACETAMINOPHEN 650 MG RE SUPP: 650.0000 mg | Freq: Four times a day (QID) | RECTAL | Status: DC | PRN

## 2017-02-18 MED ORDER — IPRATROPIUM-ALBUTEROL 0.5-2.5 (3) MG/3ML IN SOLN
3.0000 mL | Freq: Once | RESPIRATORY_TRACT | Status: AC
  Administered 2017-02-18: 3 mL via RESPIRATORY_TRACT
  Filled 2017-02-18: qty 3

## 2017-02-18 MED ORDER — IPRATROPIUM-ALBUTEROL 0.5-2.5 (3) MG/3ML IN SOLN
3.0000 mL | RESPIRATORY_TRACT | Status: DC | PRN
  Administered 2017-02-19: 3 mL via RESPIRATORY_TRACT
  Filled 2017-02-18: qty 3

## 2017-02-18 MED ORDER — ALBUTEROL SULFATE (2.5 MG/3ML) 0.083% IN NEBU
2.5000 mg | INHALATION_SOLUTION | Freq: Once | RESPIRATORY_TRACT | Status: AC
  Administered 2017-02-18: 2.5 mg via RESPIRATORY_TRACT
  Filled 2017-02-18: qty 3

## 2017-02-18 MED ORDER — METHYLPREDNISOLONE SODIUM SUCC 125 MG IJ SOLR
125.0000 mg | Freq: Once | INTRAMUSCULAR | Status: AC
  Administered 2017-02-18: 125 mg via INTRAVENOUS
  Filled 2017-02-18: qty 2

## 2017-02-18 MED ORDER — METHYLPREDNISOLONE SODIUM SUCC 125 MG IJ SOLR
60.0000 mg | Freq: Three times a day (TID) | INTRAMUSCULAR | Status: DC
  Administered 2017-02-19: 60 mg via INTRAVENOUS
  Filled 2017-02-18: qty 2

## 2017-02-18 MED ORDER — INFLUENZA VAC SPLIT HIGH-DOSE 0.5 ML IM SUSY
0.5000 mL | PREFILLED_SYRINGE | INTRAMUSCULAR | Status: AC
  Administered 2017-02-19: 0.5 mL via INTRAMUSCULAR
  Filled 2017-02-18: qty 0.5

## 2017-02-18 MED ORDER — GUAIFENESIN ER 600 MG PO TB12
600.0000 mg | ORAL_TABLET | Freq: Two times a day (BID) | ORAL | Status: DC
  Administered 2017-02-18 – 2017-02-19 (×2): 600 mg via ORAL
  Filled 2017-02-18 (×4): qty 1

## 2017-02-18 MED ORDER — ONDANSETRON HCL 4 MG PO TABS: 4.0000 mg | ORAL_TABLET | Freq: Four times a day (QID) | ORAL | Status: DC | PRN

## 2017-02-18 MED ORDER — PNEUMOCOCCAL VAC POLYVALENT 25 MCG/0.5ML IJ INJ
0.5000 mL | INJECTION | INTRAMUSCULAR | Status: AC
  Administered 2017-02-19: 0.5 mL via INTRAMUSCULAR
  Filled 2017-02-18: qty 0.5

## 2017-02-18 MED ORDER — ENOXAPARIN SODIUM 40 MG/0.4ML ~~LOC~~ SOLN
40.0000 mg | SUBCUTANEOUS | Status: DC
  Administered 2017-02-18: 40 mg via SUBCUTANEOUS
  Filled 2017-02-18: qty 0.4

## 2017-02-18 MED ORDER — MAGNESIUM SULFATE 2 GM/50ML IV SOLN
2.0000 g | Freq: Once | INTRAVENOUS | Status: AC
  Administered 2017-02-18: 2 g via INTRAVENOUS
  Filled 2017-02-18: qty 50

## 2017-02-18 MED ORDER — LEVOFLOXACIN IN D5W 500 MG/100ML IV SOLN
500.0000 mg | INTRAVENOUS | Status: DC
  Administered 2017-02-18: 500 mg via INTRAVENOUS
  Filled 2017-02-18: qty 100

## 2017-02-18 MED ORDER — ONDANSETRON HCL 4 MG/2ML IJ SOLN: 4.0000 mg | Freq: Four times a day (QID) | INTRAMUSCULAR | Status: DC | PRN

## 2017-02-18 NOTE — H&P (Addendum)
History and Physical    Brent Collins GEX:[id number] DOB: 04/29/1950 DOA: 02/18/2017  Referring MD/NP/PA: Dr. Bethann BerkshireJoseph Zammit PCP: Patient, No Pcp Per  Patient coming from: home   Chief Complaint: "my breathing was bad"  I have personally briefly reviewed patient's old medical records in [facility]  HPI: Brent Collins is a 66 y.o. male with medical history significant of asthma/COPD, HTN, and diastolic dysfunction last EF 60-65% with grade 1dFx; who presents with complaints of progressively worsening shortness of breath over the last 3-4 days.  Normally patient is not on oxygen at home.  Patient reports having a productive cough with yellow sputum production.  Reports getting into coughing spells which worsens shortness of breath.  Associated symptoms include subjective fever, chills, sinus congestion, headache, wheezing, posttussive emesis, abdominal discomfort with cough, and poor appetite.  Tried utilizing home inhalers without relief of symptoms.  Reports that he has a nebulizer machine, but no medications to go in it at this time.  He also took Pepto-Bismol which helped with the stomach discomfort that he was having.  He denies any recent tobacco use.  He was last admitted approximately 6 months ago for a COPD exacerbation.  Denies any chest pain, recent sick contacts, prolonged immobilization, myalgias, sore throat, dysuria symptoms.  In further investigation appears patient has not been on any of his previously prescribed medications noting that they made him feel bad.  ED Course: Upon admission into the emergency department patient was noted to be afebrile, pulse 86-96, respirations 18-23, blood pressures maintained,  and O2 saturations noted as low as 79% with ambulation for which patient was placed on 3 L cannula oxygen with improvement of oxygen saturations.  Labs revealed WBC 8.5, BUN 22, creatinine 0.87, and all other lab work relatively within normal limits.  Chest x-ray showed  increased interstitial markings suggestive of bronchitic changes.  Patient was given 125 mg Solu-Medrol, 2 g of magnesium sulfate , and multiple breathing treatments with some mild improvement.  [facility] called to admit for suspected COPD exacerbation.  Review of Systems  Constitutional: Positive for chills, fever and malaise/fatigue.  HENT: Positive for congestion and sinus pain. Negative for nosebleeds.   Eyes: Negative for photophobia and pain.  Respiratory: Positive for cough, sputum production, shortness of breath and wheezing.   Cardiovascular: Negative for chest pain and leg swelling.  Gastrointestinal: Positive for abdominal pain and vomiting (Posttussive).  Genitourinary: Negative for dysuria and frequency.  Musculoskeletal: Negative for falls and myalgias.  Skin: Negative for itching and rash.  Neurological: Positive for headaches. Negative for seizures and loss of consciousness.  Psychiatric/Behavioral: Negative for memory loss and substance abuse.    Past Medical History:  Diagnosis Date  . Asthma   . COPD (chronic obstructive pulmonary disease) (HCC)   . Inguinal hernia     History reviewed. No pertinent surgical history.   reports that  has never smoked. he has never used smokeless tobacco. He reports that he does not drink alcohol or use drugs.  Allergies  Allergen Reactions  . Tomato Shortness Of Breath and Nausea And Vomiting    No pertinent family history of any cardiac issues.  Prior to Admission medications   Medication Sig Start Date End Date Taking? Authorizing Provider  albuterol (PROVENTIL HFA;VENTOLIN HFA) 108 (90 BASE) MCG/ACT inhaler Inhale 2 puffs into the lungs every 4 (four) hours as needed for wheezing or shortness of breath. 03/02/13   Azalia Bilisampos, Kevin, MD  amLODipine (NORVASC) 10 MG tablet Take  1 tablet (10 mg total) by mouth daily. 07/07/16   Leroy SeaSingh, Prashant K, MD  carvedilol (COREG) 6.25 MG tablet Take 1 tablet (6.25 mg total) by mouth 2 (two) times  daily with a meal. 07/07/16   Leroy SeaSingh, Prashant K, MD  furosemide (LASIX) 40 MG tablet Take 1 tablet (40 mg total) by mouth daily. 07/07/16   Leroy SeaSingh, Prashant K, MD  ipratropium-albuterol (DUONEB) 0.5-2.5 (3) MG/3ML SOLN Take 3 mLs by nebulization every 4 (four) hours as needed. 07/07/16   Leroy SeaSingh, Prashant K, MD  isosorbide mononitrate (IMDUR) 30 MG 24 hr tablet Take 1 tablet (30 mg total) by mouth daily. 07/07/16   Leroy SeaSingh, Prashant K, MD  potassium chloride SA (K-DUR,KLOR-CON) 20 MEQ tablet Take 1 tablet (20 mEq total) by mouth daily. 07/07/16   Leroy SeaSingh, Prashant K, MD  predniSONE (DELTASONE) 5 MG tablet Label  & dispense according to the schedule below. take 8 Pills PO for 3 days, 6 Pills PO for 3 days, 4 Pills PO for 3 days, 2 Pills PO for 3 days, 1 Pills PO for 3 days, 1/2 Pill  PO for 3 days then STOP. Total 65 pills. 07/07/16   Leroy SeaSingh, Prashant K, MD    Physical Exam:  Constitutional: Elderly male who appears to be some respiratory distress. Vitals:   02/18/17 1858 02/18/17 1900 02/18/17 1930 02/18/17 1934  BP:  (!) 154/83 131/74   Pulse:  96 94   Resp:  (!) 23 (!) 21   Temp:      TempSrc:      SpO2: 94% (!) 89% 98% 100%  Weight:      Height:       Eyes: PERRL, lids and conjunctivae normal ENMT: Mucous membranes are dry. Posterior pharynx clear of any exudate or lesions. Neck: Normal, supple, no masses, no thyromegaly Respiratory: Mildly tachypneic with decreased air ration and positive expiratory wheezes appreciated.  Patient able to talk and shortened sentences on 3 L nasal cannula oxygen at this time. Cardiovascular: Regular rate and rhythm, no murmurs / rubs / gallops. No extremity edema. 2+ pedal pulses. No carotid bruits.  Abdomen: no tenderness, no masses palpated. No hepatosplenomegaly. Bowel sounds positive.  Musculoskeletal: no clubbing / cyanosis. No joint deformity upper and lower extremities. Good ROM, no contractures. Normal muscle tone.  Skin: no rashes, lesions, ulcers. No  induration Neurologic: CN 2-12 grossly intact. Sensation intact, DTR normal. Strength 5/5 in all 4.  Psychiatric: Normal judgment and insight. Alert and oriented x 3. Normal mood.     Labs on Admission: I have personally reviewed following labs and imaging studies  CBC: Recent Labs  Lab 02/18/17 1628  WBC 8.5  NEUTROABS 5.6  HGB 16.0  HCT 48.4  MCV 92.0  PLT 245   Basic Metabolic Panel: Recent Labs  Lab 02/18/17 1628  NA 142  K 4.5  CL 105  CO2 29  GLUCOSE 114*  BUN 22*  CREATININE 0.87  CALCIUM 9.3   GFR: Estimated Creatinine Clearance: 69.5 mL/min (by C-G formula based on SCr of 0.87 mg/dL). Liver Function Tests: Recent Labs  Lab 02/18/17 1628  AST 25  ALT 19  ALKPHOS 86  BILITOT 1.0  PROT 7.5  ALBUMIN 4.2   No results for input(s): LIPASE, AMYLASE in the last 168 hours. No results for input(s): AMMONIA in the last 168 hours. Coagulation Profile: No results for input(s): INR, PROTIME in the last 168 hours. Cardiac Enzymes: No results for input(s): CKTOTAL, CKMB, CKMBINDEX, TROPONINI in the last 168  hours. BNP (last 3 results) No results for input(s): PROBNP in the last 8760 hours. HbA1C: No results for input(s): HGBA1C in the last 72 hours. CBG: No results for input(s): GLUCAP in the last 168 hours. Lipid Profile: No results for input(s): CHOL, HDL, LDLCALC, TRIG, CHOLHDL, LDLDIRECT in the last 72 hours. Thyroid Function Tests: No results for input(s): TSH, T4TOTAL, FREET4, T3FREE, THYROIDAB in the last 72 hours. Anemia Panel: No results for input(s): VITAMINB12, FOLATE, FERRITIN, TIBC, IRON, RETICCTPCT in the last 72 hours. Urine analysis: No results found for: COLORURINE, APPEARANCEUR, LABSPEC, PHURINE, GLUCOSEU, HGBUR, BILIRUBINUR, KETONESUR, PROTEINUR, UROBILINOGEN, NITRITE, LEUKOCYTESUR Sepsis Labs: No results found for this or any previous visit (from the past 240 hour(s)).   Radiological Exams on Admission: Dg Chest 2 View  Result Date:  02/18/2017 CLINICAL DATA:  Cough and shortness breath. EXAM: CHEST  2 VIEW COMPARISON:  July 06, 2016 FINDINGS: No pneumothorax. The heart, hila, and mediastinum are normal. No nodules or masses. Known bone flow infiltrates. Increased interstitial markings centrally within the lungs. Chronic changes in the shoulders. IMPRESSION: Increased interstitial markings in the lungs suggest bronchitic change. No focal infiltrate. Electronically Signed   By: Gerome Sam III M.D   On: 02/18/2017 17:08    EKG: Independently reviewed.  Increased interstitial markings that does not appear to be fluid  Assessment/Plan COPD exacerbation, hypoxia: Acute.  Patient presents with complaints of shortness of breath and productive cough.  Patient noted to be hypoxic with ambulation down to 79% and currently O2 saturations maintained on nasal cannula oxygen. - Admit to telemetry bed - Continuous pulse oximetry with nasal cannula oxygen to keep O2 saturation greater than 90% - IV Solu-Medrol 60 mg q 8 h  - Duonebs QID and prn SOB/wheezing  - Levaquin 500 mg IV qday  - Mucinex  Essential hypertension: Blood pressures noted to be as high as 157/88. Patient was previously on blood pressure medications, but it appears that he no longer takes any of these medications. - Hydralazine IV prn >SBP - Continue to monitor  Diastolic dysfunction: Last EF  noted to be 60-65% with grade 1 diastolic dysfunction on 07/06/2016.  Patient does not appear to be fluid overloaded at this time.  It appears patient was recommended to be on Lasix and imdur. - Continue to monitor  Dehydration: Found to have elevated BUN to creatinine ratio. - IV fluids of normal saline at 75 mL/h overnight  DVT prophylaxis: Lovenox  Code Status: Full Family Communication: None Disposition Plan: Discharge home in 1-2 days Consults called: none Admission status: Inpatient   Clydie Braun MD [facility] Pager [phone]   If 7PM-7AM,  please contact night-coverage [email or web address] Password [email or web address]  02/18/2017, 8:10 PM

## 2017-02-18 NOTE — ED Triage Notes (Signed)
Pt report nasal congestion/ productive cough/ SHOB x 4 days.

## 2017-02-18 NOTE — ED Notes (Signed)
Pt tolerated ambulation around nurses station with steady gait, no complaints, o2 ranging from 79-90% on room air. MD aware, Pt placed on 3L [place].

## 2017-02-18 NOTE — ED Provider Notes (Addendum)
[facility] EMERGENCY DEPARTMENT Provider Note   CSN: [id number] Arrival date & time: 02/18/17  1557     History   Chief Complaint Chief Complaint  Patient presents with  . Shortness of Breath    HPI Brent Collins is a 66 y.o. male.  Patient complains of shortness of breath.  Patient has a history of COPD.  He has been coughing with some sputum production   The history is provided by the patient.  Shortness of Breath  This is a recurrent problem. The problem occurs frequently.The current episode started more than 2 days ago. The problem has not changed since onset.Associated symptoms include wheezing. Pertinent negatives include no fever, no headaches, no cough, no chest pain, no abdominal pain and no rash. The problem's precipitants include smoke. Risk factors include smoking. He has tried nothing for the symptoms. The treatment provided no relief. He has had prior hospitalizations. He has had prior ED visits. He has had no prior ICU admissions. Associated medical issues include COPD.    Past Medical History:  Diagnosis Date  . Asthma   . COPD (chronic obstructive pulmonary disease) (HCC)   . Inguinal hernia     Patient Active Problem List   Diagnosis Date Noted  . COPD exacerbation (HCC) 07/03/2016    No past surgical history on file.     Home Medications    Prior to Admission medications   Medication Sig Start Date End Date Taking? Authorizing Provider  albuterol (PROVENTIL HFA;VENTOLIN HFA) 108 (90 BASE) MCG/ACT inhaler Inhale 2 puffs into the lungs every 4 (four) hours as needed for wheezing or shortness of breath. 03/02/13   Azalia Bilisampos, Kevin, MD  amLODipine (NORVASC) 10 MG tablet Take 1 tablet (10 mg total) by mouth daily. 07/07/16   Leroy SeaSingh, Prashant K, MD  carvedilol (COREG) 6.25 MG tablet Take 1 tablet (6.25 mg total) by mouth 2 (two) times daily with a meal. 07/07/16   Leroy SeaSingh, Prashant K, MD  furosemide (LASIX) 40 MG tablet Take 1 tablet (40 mg total) by mouth  daily. 07/07/16   Leroy SeaSingh, Prashant K, MD  ipratropium-albuterol (DUONEB) 0.5-2.5 (3) MG/3ML SOLN Take 3 mLs by nebulization every 4 (four) hours as needed. 07/07/16   Leroy SeaSingh, Prashant K, MD  isosorbide mononitrate (IMDUR) 30 MG 24 hr tablet Take 1 tablet (30 mg total) by mouth daily. 07/07/16   Leroy SeaSingh, Prashant K, MD  potassium chloride SA (K-DUR,KLOR-CON) 20 MEQ tablet Take 1 tablet (20 mEq total) by mouth daily. 07/07/16   Leroy SeaSingh, Prashant K, MD  predniSONE (DELTASONE) 5 MG tablet Label  & dispense according to the schedule below. take 8 Pills PO for 3 days, 6 Pills PO for 3 days, 4 Pills PO for 3 days, 2 Pills PO for 3 days, 1 Pills PO for 3 days, 1/2 Pill  PO for 3 days then STOP. Total 65 pills. 07/07/16   Leroy SeaSingh, Prashant K, MD    Family History No family history on file.  Social History Social History   Tobacco Use  . Smoking status: Never Smoker  . Smokeless tobacco: Never Used  Substance Use Topics  . Alcohol use: No  . Drug use: No     Allergies   Tomato   Review of Systems Review of Systems  Constitutional: Negative for appetite change, fatigue and fever.  HENT: Negative for congestion, ear discharge and sinus pressure.   Eyes: Negative for discharge.  Respiratory: Positive for shortness of breath and wheezing. Negative for cough.   Cardiovascular:  Negative for chest pain.  Gastrointestinal: Negative for abdominal pain and diarrhea.  Endocrine: Negative for polydipsia.  Genitourinary: Negative for frequency and hematuria.  Musculoskeletal: Negative for back pain.  Skin: Negative for rash.  Neurological: Negative for seizures and headaches.  Hematological: Negative for adenopathy.  Psychiatric/Behavioral: Negative for hallucinations.     Physical Exam Updated Vital Signs BP 131/74   Pulse 94   Temp 98.4 F (36.9 C) (Oral)   Resp (!) 21   Ht 5\' 6"  (1.676 m)   Wt 59.6 kg (131 lb 8 oz)   SpO2 100%   BMI 21.22 kg/m   Physical Exam  Constitutional: He is oriented to  person, place, and time. He appears well-developed.  HENT:  Head: Normocephalic.  Eyes: Conjunctivae and EOM are normal. No scleral icterus.  Neck: Neck supple. No thyromegaly present.  Cardiovascular: Normal rate and regular rhythm. Exam reveals no gallop and no friction rub.  No murmur heard. Pulmonary/Chest: No stridor. He has no wheezes. He has rales. He exhibits no tenderness.  Abdominal: He exhibits no distension. There is no tenderness. There is no rebound.  Musculoskeletal: Normal range of motion. He exhibits no edema.  Lymphadenopathy:    He has no cervical adenopathy.  Neurological: He is oriented to person, place, and time. He exhibits normal muscle tone. Coordination normal.  Skin: No rash noted. No erythema.  Psychiatric: He has a normal mood and affect. His behavior is normal.  Nursing note and vitals reviewed.    ED Treatments / Results  Labs (all labs ordered are listed, but only abnormal results are displayed) Labs Reviewed  COMPREHENSIVE METABOLIC PANEL - Abnormal; Notable for the following components:      Result Value   Glucose, Bld 114 (*)    BUN 22 (*)    All other components within normal limits  CBC WITH DIFFERENTIAL/PLATELET    EKG  EKG Interpretation None       Radiology Dg Chest 2 View  Result Date: 02/18/2017 CLINICAL DATA:  Cough and shortness breath. EXAM: CHEST  2 VIEW COMPARISON:  July 06, 2016 FINDINGS: No pneumothorax. The heart, hila, and mediastinum are normal. No nodules or masses. Known bone flow infiltrates. Increased interstitial markings centrally within the lungs. Chronic changes in the shoulders. IMPRESSION: Increased interstitial markings in the lungs suggest bronchitic change. No focal infiltrate. Electronically Signed   By: Gerome Samavid  Williams III M.D   On: 02/18/2017 17:08    Procedures Procedures (including critical care time)  Medications Ordered in ED Medications  magnesium sulfate IVPB 2 g 50 mL (not administered)    methylPREDNISolone sodium succinate (SOLU-MEDROL) 125 mg/2 mL injection 125 mg (125 mg Intravenous Given 02/18/17 1705)  ipratropium-albuterol (DUONEB) 0.5-2.5 (3) MG/3ML nebulizer solution 3 mL (3 mLs Nebulization Given 02/18/17 1658)  albuterol (PROVENTIL) (2.5 MG/3ML) 0.083% nebulizer solution 2.5 mg (2.5 mg Nebulization Given 02/18/17 1700)  ipratropium-albuterol (DUONEB) 0.5-2.5 (3) MG/3ML nebulizer solution 3 mL (3 mLs Nebulization Given 02/18/17 1858)  albuterol (PROVENTIL) (2.5 MG/3ML) 0.083% nebulizer solution 2.5 mg (2.5 mg Nebulization Given 02/18/17 1858)     Initial Impression / Assessment and Plan / ED Course  I have reviewed the triage vital signs and the nursing notes.  Pertinent labs & imaging results that were available during my care of the patient were reviewed by me and considered in my medical decision making (see chart for details).    Patient with bronchitis.  Patient will follow up with his MD if not improving  Final  Clinical Impressions(s) / ED Diagnoses   Final diagnoses:  COPD exacerbation [facility])    ED Discharge Orders    None       Bethann Berkshire, MD 02/18/17 2016    Bethann Berkshire, MD 02/18/17 2021

## 2017-02-19 DIAGNOSIS — J441 Chronic obstructive pulmonary disease with (acute) exacerbation: Secondary | ICD-10-CM | POA: Diagnosis not present

## 2017-02-19 DIAGNOSIS — J209 Acute bronchitis, unspecified: Secondary | ICD-10-CM | POA: Diagnosis not present

## 2017-02-19 DIAGNOSIS — J440 Chronic obstructive pulmonary disease with acute lower respiratory infection: Secondary | ICD-10-CM | POA: Diagnosis not present

## 2017-02-19 LAB — BASIC METABOLIC PANEL
Anion gap: 8 (ref 5–15)
BUN: 30 mg/dL — AB (ref 6–20)
CHLORIDE: 103 mmol/L (ref 101–111)
CO2: 27 mmol/L (ref 22–32)
CREATININE: 1.01 mg/dL (ref 0.61–1.24)
Calcium: 8.9 mg/dL (ref 8.9–10.3)
GFR calc Af Amer: >60 mL/min (ref >60)
GFR calc non Af Amer: >60 mL/min (ref >60)
Glucose, Bld: 199 mg/dL — ABNORMAL HIGH (ref 65–99)
Potassium: 5.1 mmol/L (ref 3.5–5.1)
SODIUM: 138 mmol/L (ref 135–145)

## 2017-02-19 LAB — CBC
HEMATOCRIT: 43.4 % (ref 39.0–52.0)
HEMOGLOBIN: 14.5 g/dL (ref 13.0–17.0)
MCH: 30.1 pg (ref 26.0–34.0)
MCHC: 33.4 g/dL (ref 30.0–36.0)
MCV: 90 fL (ref 78.0–100.0)
Platelets: 247 10*3/uL (ref 150–400)
RBC: 4.82 MIL/uL (ref 4.22–5.81)
RDW: 14.7 % (ref 11.5–15.5)
WBC: 3.8 10*3/uL — ABNORMAL LOW (ref 4.0–10.5)

## 2017-02-19 LAB — RESPIRATORY PANEL BY PCR
Adenovirus: NOT DETECTED
Bordetella pertussis: NOT DETECTED
CHLAMYDOPHILA PNEUMONIAE-RVPPCR: NOT DETECTED
Coronavirus 229E: NOT DETECTED
Coronavirus HKU1: NOT DETECTED
Coronavirus NL63: NOT DETECTED
Coronavirus OC43: NOT DETECTED
INFLUENZA A-RVPPCR: NOT DETECTED
INFLUENZA B-RVPPCR: NOT DETECTED
MYCOPLASMA PNEUMONIAE-RVPPCR: NOT DETECTED
Metapneumovirus: NOT DETECTED
PARAINFLUENZA VIRUS 4-RVPPCR: NOT DETECTED
Parainfluenza Virus 1: NOT DETECTED
Parainfluenza Virus 2: NOT DETECTED
Parainfluenza Virus 3: NOT DETECTED
RESPIRATORY SYNCYTIAL VIRUS-RVPPCR: NOT DETECTED
Rhinovirus / Enterovirus: NOT DETECTED

## 2017-02-19 MED ORDER — CARVEDILOL 6.25 MG PO TABS: 6.2500 mg | ORAL_TABLET | Freq: Two times a day (BID) | ORAL | 2 refills | Status: DC

## 2017-02-19 MED ORDER — AMLODIPINE BESYLATE 10 MG PO TABS: 10.0000 mg | ORAL_TABLET | Freq: Every day | ORAL | 2 refills | Status: DC

## 2017-02-19 MED ORDER — PREDNISONE 10 MG PO TABS: 10.0000 mg | ORAL_TABLET | Freq: Every day | ORAL | 0 refills | Status: DC

## 2017-02-19 MED ORDER — ALBUTEROL SULFATE HFA 108 (90 BASE) MCG/ACT IN AERS: 2.0000 | INHALATION_SPRAY | RESPIRATORY_TRACT | 2 refills | Status: DC | PRN

## 2017-02-19 MED ORDER — LEVOFLOXACIN 750 MG PO TABS: 750.0000 mg | ORAL_TABLET | Freq: Every day | ORAL | 0 refills | Status: AC

## 2017-02-19 MED ORDER — ISOSORBIDE MONONITRATE ER 30 MG PO TB24: 30.0000 mg | ORAL_TABLET | Freq: Every day | ORAL | 2 refills | Status: DC

## 2017-02-19 NOTE — Progress Notes (Signed)
OT Screen Note  Patient Details Name: Sibyl ParrMichael W Ing MRN: [id number] DOB: 03/04/1951   Cancelled Treatment:    Reason Eval/Treat Not Completed: OT screened, no needs identified, will sign off    Limmie PatriciaLaura Addilee Neu, OTR/L,CBIS  [phone]  02/19/2017, 11:03 AM

## 2017-02-19 NOTE — Progress Notes (Addendum)
Patient IV removed, telemetry removed, tolerated well. Discharge instructions given at bedside.  

## 2017-02-19 NOTE — Care Management Note (Signed)
Case Management Note  Patient Details  Name: Sibyl ParrMichael W Lequire MRN: [id number] Date of Birth: 06/21/1950               Admitted with COPD exacerbation. Pt lives with his sister. He is retired, ind with ADL's. He drives. He has no insurance but no PCP. He has neb machine but no treatments. He was seen in April and given list of PCP's to contact to establish care. Pt did not do this. CM provided another list, explaining that he had to make contact with office in order to establish care. CM encouraged pt to choose from list and call while waiting on his DC paperwork. MD will order supply of neb treatments. Pt will not require supplemental oxygen per flow sheet documentation.   Expected Discharge Date:  02/19/17               Expected Discharge Plan:  Home/Self Care  In-House Referral:  NA  Discharge planning Services  CM Consult  Post Acute Care Choice:  NA Choice offered to:  NA  Status of Service:  Completed, signed off   Malcolm MetroChildress, Lucrezia Dehne Demske, RN 02/19/2017, 12:04 PM

## 2017-02-19 NOTE — Evaluation (Signed)
Physical Therapy Evaluation Patient Details Name: Brent Collins MRN: [id number] DOB: 12/30/1950 Today's Date: 02/19/2017   History of Present Illness  Brent Collins is a 66yo white male [facility] 3-4d of SOB, admitted with COPD exacerbation/bronchitis. PMH: asthmas, COPD. At baseline, pt is a Tourist information centre managercommunity ambulator, fully independent in ADL/IADL/driving, some mild limitations to exersion.   Clinical Impression  Pt admitted with above diagnosis. Pt currently with functional limitations due to the deficits listed below (see "PT Problem List"). Functional mobility assessment demonstrates minimal impairment in activity tolerance AEB desaturation on 2LPM O2, whereas pt is able to AMB freely on room-air at baseline. No acute impairment of strength or increased falls risk noted. Patient is near baseline, all education completed, and time is given to address all questions/concerns. Remaining functional deficits are being managed medically at this time. No additional skilled PT services needed at this time, PT signing off. PT recommends daily ambulation ad lib or with nursing staff as needed to prevent deconditioning.     Follow Up Recommendations No PT follow up    Equipment Recommendations  None recommended by PT    Recommendations for Other Services       Precautions / Restrictions Precautions Precautions: None Restrictions Weight Bearing Restrictions: No      Mobility  Bed Mobility Overal bed mobility: Independent                Transfers Overall transfer level: Independent                  Ambulation/Gait Ambulation/Gait assistance: Independent              Stairs            Wheelchair Mobility    Modified Rankin (Stroke Patients Only)       Balance Overall balance assessment: No apparent balance deficits (not formally assessed);Independent                                           Pertinent Vitals/Pain  Pain Assessment: No/denies pain    Home Living Family/patient expects to be discharged to:: Private residence Living Arrangements: Other relatives(niece, nephew, other family. ) Available Help at Discharge: Available PRN/intermittently Type of Home: Mobile home Home Access: Stairs to enter Entrance Stairs-Rails: None Entrance Stairs-Number of Steps: 4 Home Layout: One level Home Equipment: None      Prior Function Level of Independence: Independent               Hand Dominance   Dominant Hand: Right    Extremity/Trunk Assessment                Communication   Communication: No difficulties  Cognition Arousal/Alertness: Awake/alert Behavior During Therapy: WFL for tasks assessed/performed Overall Cognitive Status: Within Functional Limits for tasks assessed                                        General Comments      Exercises     Assessment/Plan    PT Assessment Patent does not need any further PT services  PT Problem List         PT Treatment Interventions      PT Goals (Current goals can be found in the Care  Plan section)  Acute Rehab PT Goals PT Goal Formulation: All assessment and education complete, DC therapy    Frequency     Barriers to discharge        Co-evaluation               AM-PAC PT "6 Clicks" Daily Activity  Outcome Measure Difficulty turning over in bed (including adjusting bedclothes, sheets and blankets)?: None Difficulty moving from lying on back to sitting on the side of the bed? : None Difficulty sitting down on and standing up from a chair with arms (e.g., wheelchair, bedside commode, etc,.)?: None Help needed moving to and from a bed to chair (including a wheelchair)?: None Help needed walking in hospital room?: None Help needed climbing 3-5 steps with a railing? : None 6 Click Score: 24    End of Session Equipment Utilized During Treatment: Oxygen Activity Tolerance: Patient tolerated  treatment well;No increased pain;Treatment limited secondary to medical complications (Comment)(desaturation on O2 ) Patient left: in chair;with call bell/phone within reach Nurse Communication: Mobility status PT Visit Diagnosis: Difficulty in walking, not elsewhere classified (R26.2);Other abnormalities of gait and mobility (R26.89)    Time: [id number] PT Time Calculation (min) (ACUTE ONLY): 12 min   Charges:   PT Evaluation $PT Eval Low Complexity: 1 Low     PT G Codes:   PT G-Codes **NOT FOR INPATIENT CLASS** Functional Assessment Tool Used: AM-PAC 6 Clicks Basic Mobility Functional Limitation: Mobility: Walking and moving around Mobility: Walking and Moving Around Current Status ([id number](G8978): At least 20 percent but less than 40 percent impaired, limited or restricted Mobility: Walking and Moving Around Goal Status [phone](G8979): At least 20 percent but less than 40 percent impaired, limited or restricted Mobility: Walking and Moving Around Discharge Status [phone]): At least 20 percent but less than 40 percent impaired, limited or restricted    3:46 PM, 02/19/17 Rosamaria LintsAllan C Darrol Brandenburg, PT, DPT Physical Therapist at [facility] [phone] (office)     Leshawn Houseworth C 02/19/2017, 3:43 PM

## 2017-02-19 NOTE — Discharge Summary (Signed)
Physician Discharge Summary  Brent Collins UJW:[id number] DOB: 12/25/1950 DOA: 02/18/2017  PCP: Patient, No Pcp Per  Admit date: 02/18/2017 Discharge date: 02/19/2017  Time spent: 45 minutes  Recommendations for Outpatient Follow-up:  -Will be discharged home today. -Advised to follow up with PCP in 2 weeks.  -To complete 5 days of levaquin and a prednisone taper.  Discharge Diagnoses:  Principal Problem:   COPD with acute bronchitis (HCC) Active Problems:   Hypoxia   Benign essential HTN   Diastolic dysfunction   Dehydration   Discharge Condition: Stable and improved  Filed Weights   02/18/17 1606 02/18/17 2220  Weight: 59.6 kg (131 lb 8 oz) 59.8 kg (131 lb 12.8 oz)    History of present illness:  As per Dr. Katrinka BlazingSmith on 11/18: Brent ParrMichael W Collins is a 66 y.o. male with medical history significant of asthma/COPD, HTN, and diastolic dysfunction last EF 60-65% with grade 1dFx; who presents with complaints of progressively worsening shortness of breath over the last 3-4 days.  Normally patient is not on oxygen at home.  Patient reports having a productive cough with yellow sputum production.  Reports getting into coughing spells which worsens shortness of breath.  Associated symptoms include subjective fever, chills, sinus congestion, headache, wheezing, posttussive emesis, abdominal discomfort with cough, and poor appetite.  Tried utilizing home inhalers without relief of symptoms.  Reports that he has a nebulizer machine, but no medications to go in it at this time.  He also took Pepto-Bismol which helped with the stomach discomfort that he was having.  He denies any recent tobacco use.  He was last admitted approximately 6 months ago for a COPD exacerbation.  Denies any chest pain, recent sick contacts, prolonged immobilization, myalgias, sore throat, dysuria symptoms.  In further investigation appears patient has not been on any of his previously prescribed medications noting that  they made him feel bad.  ED Course: Upon admission into the emergency department patient was noted to be afebrile, pulse 86-96, respirations 18-23, blood pressures maintained,  and O2 saturations noted as low as 79% with ambulation for which patient was placed on 3 L cannula oxygen with improvement of oxygen saturations.  Labs revealed WBC 8.5, BUN 22, creatinine 0.87, and all other lab work relatively within normal limits.  Chest x-ray showed increased interstitial markings suggestive of bronchitic changes.  Patient was given 125 mg Solu-Medrol, 2 g of magnesium sulfate , and multiple breathing treatments with some mild improvement.  TRH called to admit for suspected COPD exacerbation.     Hospital Course:   Acute hypoxemic respiratory failure -On account of COPD with acute exacerbation. -At time of discharge patient has no oxygen requirement. -Will be sent home with 5 days of Levaquin, prednisone taper and nebs. -He has been advised to secure an outpatient provider.  Essential hypertension -Continue norvasc, coreg, imdur on DC. -SBP between 120-150 upon DC.  Procedures:  None   Consultations:  None  Discharge Instructions  Discharge Instructions    Diet - low sodium heart healthy   Complete by:  As directed    Increase activity slowly   Complete by:  As directed      Allergies as of 02/19/2017      Reactions   Tomato Shortness Of Breath, Nausea And Vomiting      Medication List    STOP taking these medications   furosemide 40 MG tablet Commonly known as:  LASIX   potassium chloride SA 20 MEQ tablet  Commonly known as:  K-DUR,KLOR-CON     TAKE these medications   albuterol 108 (90 Base) MCG/ACT inhaler Commonly known as:  PROVENTIL HFA;VENTOLIN HFA Inhale 2 puffs every 4 (four) hours as needed into the lungs for wheezing or shortness of breath.   amLODipine 10 MG tablet Commonly known as:  NORVASC Take 1 tablet (10 mg total) daily by mouth.   carvedilol  6.25 MG tablet Commonly known as:  COREG Take 1 tablet (6.25 mg total) 2 (two) times daily with a meal by mouth.   ibuprofen 200 MG tablet Commonly known as:  ADVIL,MOTRIN Take 200 mg every 6 (six) hours as needed by mouth for moderate pain.   isosorbide mononitrate 30 MG 24 hr tablet Commonly known as:  IMDUR Take 1 tablet (30 mg total) daily by mouth.   levofloxacin 750 MG tablet Commonly known as:  LEVAQUIN Take 1 tablet (750 mg total) daily for 5 days by mouth.   predniSONE 10 MG tablet Commonly known as:  DELTASONE Take 1 tablet (10 mg total) daily with breakfast by mouth. Take 6 tablets today and then decrease by 1 tablet daily until none are left.      Allergies  Allergen Reactions  . Tomato Shortness Of Breath and Nausea And Vomiting   Follow-up Information    new PCP. Schedule an appointment as soon as possible for a visit in 2 week(s).            The results of significant diagnostics from this hospitalization (including imaging, microbiology, ancillary and laboratory) are listed below for reference.    Significant Diagnostic Studies: Dg Chest 2 View  Result Date: 02/18/2017 CLINICAL DATA:  Cough and shortness breath. EXAM: CHEST  2 VIEW COMPARISON:  July 06, 2016 FINDINGS: No pneumothorax. The heart, hila, and mediastinum are normal. No nodules or masses. Known bone flow infiltrates. Increased interstitial markings centrally within the lungs. Chronic changes in the shoulders. IMPRESSION: Increased interstitial markings in the lungs suggest bronchitic change. No focal infiltrate. Electronically Signed   By: Gerome Samavid  Williams III M.D   On: 02/18/2017 17:08    Microbiology: No results found for this or any previous visit (from the past 240 hour(s)).   Labs: Basic Metabolic Panel: Recent Labs  Lab 02/18/17 1628 02/19/17 0545  NA 142 138  K 4.5 5.1  CL 105 103  CO2 29 27  GLUCOSE 114* 199*  BUN 22* 30*  CREATININE 0.87 1.01  CALCIUM 9.3 8.9   Liver  Function Tests: Recent Labs  Lab 02/18/17 1628  AST 25  ALT 19  ALKPHOS 86  BILITOT 1.0  PROT 7.5  ALBUMIN 4.2   No results for input(s): LIPASE, AMYLASE in the last 168 hours. No results for input(s): AMMONIA in the last 168 hours. CBC: Recent Labs  Lab 02/18/17 1628 02/19/17 0545  WBC 8.5 3.8*  NEUTROABS 5.6  --   HGB 16.0 14.5  HCT 48.4 43.4  MCV 92.0 90.0  PLT 245 247   Cardiac Enzymes: No results for input(s): CKTOTAL, CKMB, CKMBINDEX, TROPONINI in the last 168 hours. BNP: BNP (last 3 results) Recent Labs    07/05/16 0849  BNP 127.0*    ProBNP (last 3 results) No results for input(s): PROBNP in the last 8760 hours.  CBG: No results for input(s): GLUCAP in the last 168 hours.     Signed:  Chaya Janstela Hernandez Acosta  [facility] Pager: [phone] 02/19/2017, 6:09 PM

## 2017-02-19 NOTE — Clinical Social Work Note (Signed)
Pt does not have 3 admissions in six months and therefore does not meet the COPD Gold protocol. LCSW signing off.

## 2019-11-28 ENCOUNTER — Other Ambulatory Visit: Payer: Self-pay

## 2019-11-28 ENCOUNTER — Encounter (HOSPITAL_COMMUNITY): Payer: Self-pay

## 2019-11-28 ENCOUNTER — Emergency Department (HOSPITAL_COMMUNITY)
Admission: EM | Admit: 2019-11-28 | Discharge: 2019-11-29 | Disposition: A | Discharge status: Home / Self Care | Payer: Medicare Other | Attending: Emergency Medicine | Admitting: Emergency Medicine

## 2019-11-28 DIAGNOSIS — M26629 Arthralgia of temporomandibular joint, unspecified side: Secondary | ICD-10-CM | POA: Diagnosis not present

## 2019-11-28 DIAGNOSIS — R519 Headache, unspecified: Secondary | ICD-10-CM | POA: Diagnosis present

## 2019-11-28 DIAGNOSIS — Z79899 Other long term (current) drug therapy: Secondary | ICD-10-CM | POA: Diagnosis not present

## 2019-11-28 DIAGNOSIS — J0131 Acute recurrent sphenoidal sinusitis: Secondary | ICD-10-CM | POA: Diagnosis not present

## 2019-11-28 DIAGNOSIS — U071 COVID-19: Secondary | ICD-10-CM | POA: Diagnosis not present

## 2019-11-28 DIAGNOSIS — I10 Essential (primary) hypertension: Secondary | ICD-10-CM | POA: Diagnosis not present

## 2019-11-28 DIAGNOSIS — J3489 Other specified disorders of nose and nasal sinuses: Secondary | ICD-10-CM | POA: Insufficient documentation

## 2019-11-28 DIAGNOSIS — J441 Chronic obstructive pulmonary disease with (acute) exacerbation: Secondary | ICD-10-CM | POA: Insufficient documentation

## 2019-11-28 DIAGNOSIS — J019 Acute sinusitis, unspecified: Secondary | ICD-10-CM

## 2019-11-28 DIAGNOSIS — J45909 Unspecified asthma, uncomplicated: Secondary | ICD-10-CM | POA: Insufficient documentation

## 2019-11-28 DIAGNOSIS — Z23 Encounter for immunization: Secondary | ICD-10-CM | POA: Insufficient documentation

## 2019-11-28 NOTE — ED Triage Notes (Addendum)
Pt reports headache across the head for over a weeks and it feels like it makes him sick and not able to eat good. Pt feels weak

## 2019-11-29 ENCOUNTER — Emergency Department (HOSPITAL_COMMUNITY): Payer: Medicare Other

## 2019-11-29 DIAGNOSIS — U071 COVID-19: Secondary | ICD-10-CM | POA: Diagnosis not present

## 2019-11-29 DIAGNOSIS — Z23 Encounter for immunization: Secondary | ICD-10-CM | POA: Diagnosis not present

## 2019-11-29 DIAGNOSIS — R519 Headache, unspecified: Secondary | ICD-10-CM | POA: Diagnosis present

## 2019-11-29 LAB — BASIC METABOLIC PANEL
Anion gap: 13 (ref 5–15)
BUN: 34 mg/dL — ABNORMAL HIGH (ref 8–23)
CO2: 25 mmol/L (ref 22–32)
Calcium: 8.8 mg/dL — ABNORMAL LOW (ref 8.9–10.3)
Chloride: 98 mmol/L (ref 98–111)
Creatinine, Ser: 1 mg/dL (ref 0.61–1.24)
GFR calc Af Amer: >60 mL/min (ref >60)
GFR calc non Af Amer: >60 mL/min (ref >60)
Glucose, Bld: 118 mg/dL — ABNORMAL HIGH (ref 70–99)
Potassium: 4.7 mmol/L (ref 3.5–5.1)
Sodium: 136 mmol/L (ref 135–145)

## 2019-11-29 LAB — CBC WITH DIFFERENTIAL/PLATELET
Abs Immature Granulocytes: 0.01 10*3/uL (ref 0.00–0.07)
Basophils Absolute: 0 10*3/uL (ref 0.0–0.1)
Basophils Relative: 1 %
Eosinophils Absolute: 0 10*3/uL (ref 0.0–0.5)
Eosinophils Relative: 0 %
HCT: 50.1 % (ref 39.0–52.0)
Hemoglobin: 17.1 g/dL — ABNORMAL HIGH (ref 13.0–17.0)
Immature Granulocytes: 0 %
Lymphocytes Relative: 23 %
Lymphs Abs: 1.1 10*3/uL (ref 0.7–4.0)
MCH: 31 pg (ref 26.0–34.0)
MCHC: 34.1 g/dL (ref 30.0–36.0)
MCV: 90.8 fL (ref 80.0–100.0)
Monocytes Absolute: 0.4 10*3/uL (ref 0.1–1.0)
Monocytes Relative: 9 %
Neutro Abs: 3 10*3/uL (ref 1.7–7.7)
Neutrophils Relative %: 67 %
Platelets: 147 10*3/uL — ABNORMAL LOW (ref 150–400)
RBC: 5.52 MIL/uL (ref 4.22–5.81)
RDW: 13.6 % (ref 11.5–15.5)
WBC: 4.6 10*3/uL (ref 4.0–10.5)
nRBC: 0 % (ref 0.0–0.2)

## 2019-11-29 LAB — SARS CORONAVIRUS 2 BY RT PCR (HOSPITAL ORDER, PERFORMED IN ~~LOC~~ HOSPITAL LAB): SARS Coronavirus 2: POSITIVE — AB

## 2019-11-29 MED ORDER — SODIUM CHLORIDE 0.9 % IV SOLN
1200.0000 mg | Freq: Once | INTRAVENOUS | Status: AC
  Administered 2019-11-29: 1200 mg via INTRAVENOUS
  Filled 2019-11-29: qty 10

## 2019-11-29 MED ORDER — ALBUTEROL SULFATE HFA 108 (90 BASE) MCG/ACT IN AERS: 2.0000 | INHALATION_SPRAY | Freq: Once | RESPIRATORY_TRACT | Status: DC | PRN

## 2019-11-29 MED ORDER — KETOROLAC TROMETHAMINE 30 MG/ML IJ SOLN
15.0000 mg | Freq: Once | INTRAMUSCULAR | Status: AC
  Administered 2019-11-29: 15 mg via INTRAVENOUS
  Filled 2019-11-29: qty 1

## 2019-11-29 MED ORDER — DIPHENHYDRAMINE HCL 50 MG/ML IJ SOLN: 50.0000 mg | Freq: Once | INTRAMUSCULAR | Status: DC | PRN

## 2019-11-29 MED ORDER — METOCLOPRAMIDE HCL 5 MG/ML IJ SOLN
10.0000 mg | Freq: Once | INTRAMUSCULAR | Status: AC
  Administered 2019-11-29: 10 mg via INTRAVENOUS
  Filled 2019-11-29: qty 2

## 2019-11-29 MED ORDER — FAMOTIDINE IN NACL 20-0.9 MG/50ML-% IV SOLN: 20.0000 mg | Freq: Once | INTRAVENOUS | Status: DC | PRN

## 2019-11-29 MED ORDER — AMOXICILLIN-POT CLAVULANATE 875-125 MG PO TABS
1.0000 | ORAL_TABLET | Freq: Once | ORAL | Status: AC
  Administered 2019-11-29: 1 via ORAL
  Filled 2019-11-29: qty 1

## 2019-11-29 MED ORDER — SODIUM CHLORIDE 0.9 % IV SOLN: INTRAVENOUS | Status: DC | PRN

## 2019-11-29 MED ORDER — EPINEPHRINE 0.3 MG/0.3ML IJ SOAJ: 0.3000 mg | Freq: Once | INTRAMUSCULAR | Status: DC | PRN

## 2019-11-29 MED ORDER — METHYLPREDNISOLONE SODIUM SUCC 125 MG IJ SOLR: 125.0000 mg | Freq: Once | INTRAMUSCULAR | Status: DC | PRN

## 2019-11-29 MED ORDER — AMOXICILLIN-POT CLAVULANATE 875-125 MG PO TABS: 1.0000 | ORAL_TABLET | Freq: Two times a day (BID) | ORAL | 0 refills | Status: DC

## 2019-11-29 MED ORDER — DIPHENHYDRAMINE HCL 50 MG/ML IJ SOLN
25.0000 mg | Freq: Once | INTRAMUSCULAR | Status: AC
  Administered 2019-11-29: 25 mg via INTRAVENOUS
  Filled 2019-11-29: qty 1

## 2019-11-29 MED ORDER — SODIUM CHLORIDE 0.9 % IV BOLUS
1000.0000 mL | Freq: Once | INTRAVENOUS | Status: AC
  Administered 2019-11-29: 1000 mL via INTRAVENOUS

## 2019-11-29 NOTE — Discharge Instructions (Signed)
Your Covid test is positive.  Keep your self quarantined at home.  Use Tylenol or Motrin as needed for aches and fevers.  Take antibiotics for possible sinus infection. Return to the ED with difficulty breathing, chest pain, not able to eat or drink, confusion, any other concerns.

## 2019-11-29 NOTE — ED Notes (Signed)
Date and time results received: 11/29/19 0418  Test: COVID Critical Value: POSITIVE  Name of Provider Notified: Rancour, MD  Orders Received? Or Actions Taken?: acknowledged

## 2019-11-29 NOTE — ED Provider Notes (Signed)
[facility] EMERGENCY DEPARTMENT Provider Note   CSN: [id number] Arrival date & time: 11/28/19  1627     History Chief Complaint  Patient presents with  . Headache    Brent Collins is a 69 y.o. male.  Patient is a poor historian.  Reports she is here with a diffuse headache that onset about 4 days ago is progressively getting worse.  Headache is across his entire forehead and radiates to both sides of his head.  Associate with nausea.  No photophobia or phonophobia.  No vomiting.  He states he has had a cough productive of clear and yellow mucus which she has normally from his COPD.  Denies thunderclap onset.  No focal weakness, numbness or tingling in his arms or legs.  No visual changes.  No chest pain or shortness of breath.  No abdominal pain.  Does have some nausea but no vomiting or diarrhea.  Feels aches across his body as well but mostly in his head and his neck. Does not take any blood thinners.  Did not receive the Covid vaccine.  The history is provided by the patient.  Headache Associated symptoms: fatigue, myalgias, nausea and weakness   Associated symptoms: no abdominal pain, no congestion, no dizziness, no fever, no numbness, no sore throat and no vomiting        Past Medical History:  Diagnosis Date  . Asthma   . COPD (chronic obstructive pulmonary disease) (HCC)   . Inguinal hernia     Patient Active Problem List   Diagnosis Date Noted  . Hypoxia 02/18/2017  . COPD with acute bronchitis (HCC) 02/18/2017  . Benign essential HTN 02/18/2017  . Diastolic dysfunction 02/18/2017  . Dehydration 02/18/2017  . COPD exacerbation (HCC) 07/03/2016    History reviewed. No pertinent surgical history.     No family history on file.  Social History   Tobacco Use  . Smoking status: Never Smoker  . Smokeless tobacco: Never Used  Vaping Use  . Vaping Use: Never used  Substance Use Topics  . Alcohol use: No  . Drug use: No    Home Medications Prior to  Admission medications   Medication Sig Start Date End Date Taking? Authorizing Provider  albuterol (PROVENTIL HFA;VENTOLIN HFA) 108 (90 Base) MCG/ACT inhaler Inhale 2 puffs every 4 (four) hours as needed into the lungs for wheezing or shortness of breath. 02/19/17   Philip Aspen, Limmie Patricia, MD  amLODipine (NORVASC) 10 MG tablet Take 1 tablet (10 mg total) daily by mouth. 02/19/17   Philip Aspen, Limmie Patricia, MD  carvedilol (COREG) 6.25 MG tablet Take 1 tablet (6.25 mg total) 2 (two) times daily with a meal by mouth. 02/19/17   Philip Aspen, Limmie Patricia, MD  ibuprofen (ADVIL,MOTRIN) 200 MG tablet Take 200 mg every 6 (six) hours as needed by mouth for moderate pain.    [provider]  isosorbide mononitrate (IMDUR) 30 MG 24 hr tablet Take 1 tablet (30 mg total) daily by mouth. 02/19/17   Philip Aspen, Limmie Patricia, MD  predniSONE (DELTASONE) 10 MG tablet Take 1 tablet (10 mg total) daily with breakfast by mouth. Take 6 tablets today and then decrease by 1 tablet daily until none are left. 02/19/17   Philip Aspen, Limmie Patricia, MD    Allergies    Tomato  Review of Systems   Review of Systems  Constitutional: Positive for chills and fatigue. Negative for activity change, appetite change and fever.  HENT: Positive for rhinorrhea. Negative for  congestion and sore throat.   Eyes: Negative for visual disturbance.  Respiratory: Negative for chest tightness and shortness of breath.   Cardiovascular: Negative for chest pain.  Gastrointestinal: Positive for nausea. Negative for abdominal pain and vomiting.  Musculoskeletal: Positive for arthralgias and myalgias.  Skin: Negative for wound.  Neurological: Positive for weakness and headaches. Negative for dizziness and numbness.   all other systems are negative except as noted in the HPI and PMH.    Physical Exam Updated Vital Signs BP (!) 169/91 (BP Location: Left Arm)   Pulse 77   Temp 98.6 F (37 C) (Oral)   Resp (!) 22   Wt 68  kg   SpO2 100%   BMI 24.21 kg/m   Physical Exam Vitals and nursing note reviewed.  Constitutional:      General: He is not in acute distress.    Appearance: Normal appearance. He is well-developed and normal weight.     Comments: Disheveled  HENT:     Head: Normocephalic and atraumatic.     Comments: No temporal artery tenderness    Mouth/Throat:     Pharynx: No oropharyngeal exudate.  Eyes:     Conjunctiva/sclera: Conjunctivae normal.     Pupils: Pupils are equal, round, and reactive to light.  Neck:     Comments: No meningismus. Full range of motion without pain.  Able to touch chin to chest Cardiovascular:     Rate and Rhythm: Normal rate and regular rhythm.     Heart sounds: Normal heart sounds. No murmur heard.   Pulmonary:     Effort: Pulmonary effort is normal. No respiratory distress.     Breath sounds: Normal breath sounds.  Chest:     Chest wall: No tenderness.  Abdominal:     Palpations: Abdomen is soft.     Tenderness: There is no abdominal tenderness. There is no guarding or rebound.  Musculoskeletal:        General: No tenderness. Normal range of motion.     Cervical back: Normal range of motion and neck supple.  Skin:    General: Skin is warm.     Capillary Refill: Capillary refill takes less than 2 seconds.  Neurological:     General: No focal deficit present.     Mental Status: He is alert and oriented to person, place, and time. Mental status is at baseline.     Cranial Nerves: No cranial nerve deficit.     Motor: No abnormal muscle tone.     Coordination: Coordination normal.     Comments: No ataxia on finger to nose bilaterally. No pronator drift. 5/5 strength throughout. CN 2-12 intact.Equal grip strength. Sensation intact.   Psychiatric:        Behavior: Behavior normal.     ED Results / Procedures / Treatments   Labs (all labs ordered are listed, but only abnormal results are displayed) Labs Reviewed  SARS CORONAVIRUS 2 BY RT PCR  (HOSPITAL ORDER, PERFORMED IN [facility]) - Abnormal; Notable for the following components:      Result Value   SARS Coronavirus 2 POSITIVE (*)    All other components within normal limits  CBC WITH DIFFERENTIAL/PLATELET - Abnormal; Notable for the following components:   Hemoglobin 17.1 (*)    Platelets 147 (*)    All other components within normal limits  BASIC METABOLIC PANEL - Abnormal; Notable for the following components:   Glucose, Bld 118 (*)    BUN 34 (*)  Calcium 8.8 (*)    All other components within normal limits    EKG None  Radiology CT Head Wo Contrast  Result Date: 11/29/2019 CLINICAL DATA:  Headache EXAM: CT HEAD WITHOUT CONTRAST TECHNIQUE: Contiguous axial images were obtained from the base of the skull through the vertex without intravenous contrast. COMPARISON:  None. FINDINGS: Brain: No acute territorial infarction, hemorrhage or intracranial mass. Mild atrophy. Nonenlarged ventricles Vascular: No hyperdense vessels.  No unexpected calcification Skull: Normal. Negative for fracture or focal lesion. Sinuses/Orbits: Moderate mucosal thickening in the right maxillary sinus with moderate mucosal disease in the ethmoid sinuses and minor disease in the frontal and sphenoid sinuses. Other: None IMPRESSION: 1. No CT evidence for acute intracranial abnormality. 2. Atrophy. 3. Paranasal sinus disease. Electronically Signed   By: Jasmine Pang M.D.   On: 11/29/2019 01:24    Procedures Procedures (including critical care time)  Medications Ordered in ED Medications  sodium chloride 0.9 % bolus 1,000 mL (has no administration in time range)  metoCLOPramide (REGLAN) injection 10 mg (has no administration in time range)  diphenhydrAMINE (BENADRYL) injection 25 mg (has no administration in time range)    ED Course  I have reviewed the triage vital signs and the nursing notes.  Pertinent labs & imaging results that were available during my care of the patient  were reviewed by me and considered in my medical decision making (see chart for details).    MDM Rules/Calculators/A&P                         4 days of gradually worsening progressive headache.  Nonfocal neurological exam.  No thunderclap onset.  No meningismus on exam.  CT shows evidence of sinusitis without acute abnormality.  Patient feels warm but refuses rectal temperature.  He understands that this is the most accurate way with measured temperature.  He does not have any meningismus on exam.  Neurological exam is nonfocal.  Does not appear to be ill or toxic.  Low suspicion for bacterial meningitis. CT scan as above shows evidence of maxillary frontal sinusitis  Headache has resolved with IV fluids, Toradol, Reglan and Benadryl.  Patient is tolerating p.o.  We will treat for suspected sinus infection with antibiotics and nasal steroids.  Chest x-ray shows no pneumonia.  Patient tolerating p.o.  No increased work of breathing or hypoxia.  Covid test found to be positive.  Patient is resting comfortably on room air with no hypoxia no chest pain or shortness of breath. This is likely contributing to his headache.  X-ray is clear.  No hypoxia or tachypnea.  No increased work of breathing.  No indication for remdesivir or steroids.  Monoclonal antibody infusion given after discussion of risks and benefits. Patient agreed and tolerated well.  Discussed home quarantine precautions, Tylenol, Motrin, PCP follow-up.  Return to the ED with chest pain, shortness of breath, any other concerns.  BP (!) 169/91 (BP Location: Left Arm)   Pulse 77   Temp 98.8 F (37.1 C)   Resp (!) 22   Wt 68 kg   SpO2 100%   BMI 24.21 kg/m     AVEER BARTOW was evaluated in Emergency Department on 11/29/2019 for the symptoms described in the history of present illness. He was evaluated in the context of the global COVID-19 pandemic, which necessitated consideration that the patient might be at risk for  infection with the SARS-CoV-2 virus that causes COVID-19. Institutional protocols and algorithms  that pertain to the evaluation of patients at risk for COVID-19 are in a state of rapid change based on information released by regulatory bodies including the CDC and federal and state organizations. These policies and algorithms were followed during the patient's care in the ED.  Final Clinical Impression(s) / ED Diagnoses Final diagnoses:  Bad headache  Acute sinusitis, recurrence not specified, unspecified location  COVID-19 virus infection    Rx / DC Orders ED Discharge Orders         Ordered    amoxicillin-clavulanate (AUGMENTIN) 875-125 MG tablet  Every 12 hours        11/29/19 0433           Glynn Octaveancour, Brynn Reznik, MD 11/29/19 [phone]

## 2020-04-11 ENCOUNTER — Other Ambulatory Visit: Payer: Self-pay

## 2020-04-11 DIAGNOSIS — J45909 Unspecified asthma, uncomplicated: Secondary | ICD-10-CM | POA: Diagnosis not present

## 2020-04-11 DIAGNOSIS — J441 Chronic obstructive pulmonary disease with (acute) exacerbation: Secondary | ICD-10-CM | POA: Insufficient documentation

## 2020-04-11 DIAGNOSIS — Z79899 Other long term (current) drug therapy: Secondary | ICD-10-CM | POA: Insufficient documentation

## 2020-04-11 DIAGNOSIS — R059 Cough, unspecified: Secondary | ICD-10-CM | POA: Diagnosis present

## 2020-04-11 DIAGNOSIS — I10 Essential (primary) hypertension: Secondary | ICD-10-CM | POA: Diagnosis not present

## 2020-04-12 ENCOUNTER — Encounter (HOSPITAL_COMMUNITY): Payer: Self-pay | Admitting: Emergency Medicine

## 2020-04-12 ENCOUNTER — Emergency Department (HOSPITAL_COMMUNITY): Payer: Medicare Other

## 2020-04-12 ENCOUNTER — Emergency Department (HOSPITAL_COMMUNITY)
Admission: EM | Admit: 2020-04-12 | Discharge: 2020-04-12 | Disposition: A | Discharge status: Home / Self Care | Payer: Medicare Other | Attending: Emergency Medicine | Admitting: Emergency Medicine

## 2020-04-12 DIAGNOSIS — J441 Chronic obstructive pulmonary disease with (acute) exacerbation: Secondary | ICD-10-CM

## 2020-04-12 LAB — CBC
HCT: 49.2 % (ref 39.0–52.0)
Hemoglobin: 16.3 g/dL (ref 13.0–17.0)
MCH: 30.6 pg (ref 26.0–34.0)
MCHC: 33.1 g/dL (ref 30.0–36.0)
MCV: 92.5 fL (ref 80.0–100.0)
Platelets: 225 10*3/uL (ref 150–400)
RBC: 5.32 MIL/uL (ref 4.22–5.81)
RDW: 13.4 % (ref 11.5–15.5)
WBC: 11.1 10*3/uL — ABNORMAL HIGH (ref 4.0–10.5)
nRBC: 0 % (ref 0.0–0.2)

## 2020-04-12 LAB — BASIC METABOLIC PANEL
Anion gap: 6 (ref 5–15)
BUN: 17 mg/dL (ref 8–23)
CO2: 30 mmol/L (ref 22–32)
Calcium: 9.2 mg/dL (ref 8.9–10.3)
Chloride: 103 mmol/L (ref 98–111)
Creatinine, Ser: 0.99 mg/dL (ref 0.61–1.24)
GFR, Estimated: >60 mL/min (ref >60)
Glucose, Bld: 155 mg/dL — ABNORMAL HIGH (ref 70–99)
Potassium: 5 mmol/L (ref 3.5–5.1)
Sodium: 139 mmol/L (ref 135–145)

## 2020-04-12 MED ORDER — AMOXICILLIN 500 MG PO CAPS: 500.0000 mg | ORAL_CAPSULE | Freq: Three times a day (TID) | ORAL | 0 refills | Status: DC

## 2020-04-12 MED ORDER — AZITHROMYCIN 250 MG PO TABS: ORAL_TABLET | ORAL | 0 refills | Status: DC

## 2020-04-12 MED ORDER — ALBUTEROL SULFATE (2.5 MG/3ML) 0.083% IN NEBU: 5.0000 mg | INHALATION_SOLUTION | Freq: Four times a day (QID) | RESPIRATORY_TRACT | 0 refills | Status: DC | PRN

## 2020-04-12 MED ORDER — ALBUTEROL SULFATE HFA 108 (90 BASE) MCG/ACT IN AERS: 2.0000 | INHALATION_SPRAY | RESPIRATORY_TRACT | Status: DC | PRN

## 2020-04-12 MED ORDER — PREDNISONE 20 MG PO TABS: ORAL_TABLET | ORAL | 0 refills | Status: DC

## 2020-04-12 MED ORDER — ALBUTEROL SULFATE HFA 108 (90 BASE) MCG/ACT IN AERS
8.0000 | INHALATION_SPRAY | Freq: Once | RESPIRATORY_TRACT | Status: AC
  Administered 2020-04-12: 8 via RESPIRATORY_TRACT
  Filled 2020-04-12: qty 6.7

## 2020-04-12 MED ORDER — AEROCHAMBER Z-STAT PLUS/MEDIUM MISC
1.0000 | Freq: Once | Status: AC
  Administered 2020-04-12: 1

## 2020-04-12 NOTE — Discharge Instructions (Addendum)
Drink plenty of fluids.  Use the inhaler 2 puffs every 4-6 hours as needed for shortness of breath.  I also gave you refills for your nebulizer machine.  Take the medications as prescribed.  You can ask for a oxygen monitor for your finger when you are at the pharmacy.  If your oxygen stays in the 80s you need to be reevaluated.  You can check your COVID test results in My Chart tomorrow.  If you have COVID start taking zinc 50 mg once a day, vitamin D and vitamin C.  It would also be helpful to take it during this time as a lot of people are getting COVID, it can help you resist getting infected again.  Return to the emergency department if you are still struggling to breathe despite using your inhaler or nebulizer.

## 2020-04-12 NOTE — ED Triage Notes (Signed)
Pt states he thought he was having an asthma attack because he "couldn't breathe". Also states he noticed a fever around 7pm but he "doesn't remember how high". Pt also with a cough.

## 2020-04-12 NOTE — ED Provider Notes (Signed)
[facility] EMERGENCY DEPARTMENT Provider Note   CSN: [id number] Arrival date & time: 04/11/20  2321   Time seen 4:09 AM  History Chief Complaint  Patient presents with  . Shortness of Breath    Brent Collins is a 70 y.o. male.  HPI   Patient states this evening around 6 PM he felt like he could not breathe.  He has had a dry cough and is unaware of fever.  He denies chest pain or shortness of breath.  He states he is having trouble swallowing.  He has been wheezing.  He has a nebulizer but he ran out of medication.  He denies chest pain, nausea, vomiting, or diarrhea.  He has not been around anybody who is sick.  Patient has not had the COVID-vaccine  PCP none  Past Medical History:  Diagnosis Date  . Asthma   . COPD (chronic obstructive pulmonary disease) (HCC)   . Inguinal hernia     Patient Active Problem List   Diagnosis Date Noted  . Hypoxia 02/18/2017  . COPD with acute bronchitis (HCC) 02/18/2017  . Benign essential HTN 02/18/2017  . Diastolic dysfunction 02/18/2017  . Dehydration 02/18/2017  . COPD exacerbation (HCC) 07/03/2016    History reviewed. No pertinent surgical history.     History reviewed. No pertinent family history.  Social History   Tobacco Use  . Smoking status: Never Smoker  . Smokeless tobacco: Never Used  Vaping Use  . Vaping Use: Never used  Substance Use Topics  . Alcohol use: No  . Drug use: No  Lives with his sister and her boyfriend who both smoke  Home Medications Prior to Admission medications   Medication Sig Start Date End Date Taking? Authorizing Provider  albuterol (PROVENTIL) (2.5 MG/3ML) 0.083% nebulizer solution Take 6 mLs (5 mg total) by nebulization every 6 (six) hours as needed for wheezing or shortness of breath. 04/12/20  Yes Devoria Albe, MD  amoxicillin (AMOXIL) 500 MG capsule Take 1 capsule (500 mg total) by mouth 3 (three) times daily. 04/12/20  Yes Devoria Albe, MD  azithromycin (ZITHROMAX) 250 MG tablet  Take 2 po the first day then once a day for the next 4 days. 04/12/20  Yes Devoria Albe, MD  predniSONE (DELTASONE) 20 MG tablet Take 2 p.o. daily for first 4 days and then 1 p.o. daily the next 4 days 04/12/20  Yes Devoria Albe, MD  amLODipine (NORVASC) 10 MG tablet Take 1 tablet (10 mg total) daily by mouth. 02/19/17   Philip Aspen, Limmie Patricia, MD  amoxicillin-clavulanate (AUGMENTIN) 875-125 MG tablet Take 1 tablet by mouth every 12 (twelve) hours. 11/29/19   Rancour, Jeannett Senior, MD  carvedilol (COREG) 6.25 MG tablet Take 1 tablet (6.25 mg total) 2 (two) times daily with a meal by mouth. 02/19/17   Philip Aspen, Limmie Patricia, MD  ibuprofen (ADVIL,MOTRIN) 200 MG tablet Take 200 mg every 6 (six) hours as needed by mouth for moderate pain.    [provider]  isosorbide mononitrate (IMDUR) 30 MG 24 hr tablet Take 1 tablet (30 mg total) daily by mouth. 02/19/17   Philip Aspen, Limmie Patricia, MD    Allergies    Tomato  Review of Systems   Review of Systems  All other systems reviewed and are negative.   Physical Exam Updated Vital Signs BP 128/84   Pulse 81   Temp 98.2 F (36.8 C) (Oral)   Resp 19   Ht 5\' 6"  (1.676 m)   Wt 68  kg   SpO2 96%   BMI 24.21 kg/m   Physical Exam Vitals and nursing note reviewed.  Constitutional:      General: He is not in acute distress.    Appearance: Normal appearance. He is normal weight.  HENT:     Head: Normocephalic and atraumatic.     Right Ear: External ear normal.     Left Ear: External ear normal.  Eyes:     Extraocular Movements: Extraocular movements intact.     Conjunctiva/sclera: Conjunctivae normal.     Pupils: Pupils are equal, round, and reactive to light.  Cardiovascular:     Rate and Rhythm: Normal rate and regular rhythm.     Pulses: Normal pulses.     Heart sounds: Normal heart sounds.  Pulmonary:     Comments: Patient has scattered expiratory wheezes.  He appears tachypneic when he talks, however his pulse ox on the monitor  was 94 to 98% and he would drop to 90 and up to 100 briefly. Musculoskeletal:        General: Normal range of motion.     Cervical back: Normal range of motion.  Skin:    General: Skin is warm and dry.  Neurological:     General: No focal deficit present.     Mental Status: He is alert and oriented to person, place, and time.     Cranial Nerves: No cranial nerve deficit.  Psychiatric:        Mood and Affect: Mood normal.        Behavior: Behavior normal.        Thought Content: Thought content normal.     ED Results / Procedures / Treatments   Labs (all labs ordered are listed, but only abnormal results are displayed) Results for orders placed or performed during the hospital encounter of 04/12/20  Basic metabolic panel  Result Value Ref Range   Sodium 139 135 - 145 mmol/L   Potassium 5.0 3.5 - 5.1 mmol/L   Chloride 103 98 - 111 mmol/L   CO2 30 22 - 32 mmol/L   Glucose, Bld 155 (H) 70 - 99 mg/dL   BUN 17 8 - 23 mg/dL   Creatinine, Ser 5.32 0.61 - 1.24 mg/dL   Calcium 9.2 8.9 - 99.2 mg/dL   GFR, Estimated >42 >68 mL/min   Anion gap 6 5 - 15  CBC  Result Value Ref Range   WBC 11.1 (H) 4.0 - 10.5 K/uL   RBC 5.32 4.22 - 5.81 MIL/uL   Hemoglobin 16.3 13.0 - 17.0 g/dL   HCT 34.1 96.2 - 22.9 %   MCV 92.5 80.0 - 100.0 fL   MCH 30.6 26.0 - 34.0 pg   MCHC 33.1 30.0 - 36.0 g/dL   RDW 79.8 92.1 - 19.4 %   Platelets 225 150 - 400 K/uL   nRBC 0.0 0.0 - 0.2 %   Laboratory interpretation all normal except leukocytosis, nonfasting hyperglycemia    EKG EKG Interpretation  Date/Time:  Monday April 12 2020 00:17:09 EST Ventricular Rate:  96 PR Interval:  164 QRS Duration: 100 QT Interval:  342 QTC Calculation: 432 R Axis:   -4 Text Interpretation: Normal sinus rhythm Right atrial enlargement Inferior infarct , age undetermined Anterior infarct , age undetermined No significant change since last tracing 10 May 2013 Confirmed by Devoria Albe ([id number]) on 04/12/2020 12:34:13  AM   Radiology DG Chest 2 View  Result Date: 04/12/2020 CLINICAL DATA:  Shortness of breath. EXAM: CHEST -  2 VIEW COMPARISON:  11/29/2019 FINDINGS: There are end-stage degenerative changes of both glenohumeral joints. The heart size is stable. Aortic calcifications are noted. There is a new retrocardiac airspace opacity. The lungs are somewhat hyperexpanded. There is a rounded pulmonary nodule in the right mid lung zone. There is no pneumothorax. No significant pleural effusion. IMPRESSION: 1. Retrocardiac airspace opacity concerning for a developing infiltrate. Follow-up to radiologic resolution is recommended. 2. Pulmonary nodule in the right mid lung zone. Attention on the follow-up examination recommended above is suggested. 3. No pneumothorax or large pleural effusion. Electronically Signed   By: Katherine Mantle M.D.   On: 04/12/2020 00:56    Procedures Procedures (including critical care time)  Medications Ordered in ED Medications  albuterol (VENTOLIN HFA) 108 (90 Base) MCG/ACT inhaler 2 puff (has no administration in time range)  albuterol (VENTOLIN HFA) 108 (90 Base) MCG/ACT inhaler 8 puff (8 puffs Inhalation Given 04/12/20 0451)  aerochamber Z-Stat Plus/medium 1 each (1 each Other Given 04/12/20 0454)    ED Course  I have reviewed the triage vital signs and the nursing notes.  Pertinent labs & imaging results that were available during my care of the patient were reviewed by me and considered in my medical decision making (see chart for details).    MDM Rules/Calculators/A&P                         At the time of my exam patient had had 2 puffs of an albuterol inhaler ordered in triage however it appears he has not gotten it.  He was given 8 puffs.  4:45 AM AM patient has not had his inhaler yet.  Recheck at 7 AM patient has had his inhalers.  He states he is feeling better.  When I listen to him now there is no more wheezing.  On the pulse ox he is in the high 90s up to  100% on room air.  There was 1 time where he dropped down to 70% however there was no waveform, I readjusted the monitor on his finger and it went rapidly up into the high 90s.  At this point I felt like patient could be discharged home.  Review of his chart shows that he had COVID 4 months ago.  He states this feels different.  His x-ray does show a possible infiltrate so he was discharged home with antibiotics.  He had run out of his inhaler which now he has 1 that was started in the ED, he also has run out of the solution for his nebulizer and that was reordered.  Final Clinical Impression(s) / ED Diagnoses Final diagnoses:  COPD exacerbation (HCC)    Rx / DC Orders ED Discharge Orders         Ordered    azithromycin (ZITHROMAX) 250 MG tablet        04/12/20 0728    predniSONE (DELTASONE) 20 MG tablet        04/12/20 0728    amoxicillin (AMOXIL) 500 MG capsule  3 times daily        04/12/20 0728    albuterol (PROVENTIL) (2.5 MG/3ML) 0.083% nebulizer solution  Every 6 hours PRN        04/12/20 0728         Plan discharge  Devoria Albe, MD, Concha Pyo, MD 04/12/20 0730

## 2020-06-17 ENCOUNTER — Other Ambulatory Visit: Payer: Self-pay

## 2020-06-17 ENCOUNTER — Encounter (HOSPITAL_COMMUNITY): Payer: Self-pay | Admitting: Emergency Medicine

## 2020-06-17 ENCOUNTER — Emergency Department (HOSPITAL_COMMUNITY)
Admission: EM | Admit: 2020-06-17 | Discharge: 2020-06-18 | Disposition: A | Discharge status: Home / Self Care | Payer: Medicare Other | Attending: Emergency Medicine | Admitting: Emergency Medicine

## 2020-06-17 ENCOUNTER — Emergency Department (HOSPITAL_COMMUNITY): Payer: Medicare Other

## 2020-06-17 DIAGNOSIS — I110 Hypertensive heart disease with heart failure: Secondary | ICD-10-CM | POA: Diagnosis not present

## 2020-06-17 DIAGNOSIS — I5030 Unspecified diastolic (congestive) heart failure: Secondary | ICD-10-CM | POA: Insufficient documentation

## 2020-06-17 DIAGNOSIS — J441 Chronic obstructive pulmonary disease with (acute) exacerbation: Secondary | ICD-10-CM | POA: Diagnosis not present

## 2020-06-17 DIAGNOSIS — Z79899 Other long term (current) drug therapy: Secondary | ICD-10-CM | POA: Diagnosis not present

## 2020-06-17 DIAGNOSIS — Z20822 Contact with and (suspected) exposure to covid-19: Secondary | ICD-10-CM | POA: Insufficient documentation

## 2020-06-17 DIAGNOSIS — J45909 Unspecified asthma, uncomplicated: Secondary | ICD-10-CM | POA: Diagnosis not present

## 2020-06-17 DIAGNOSIS — R0602 Shortness of breath: Secondary | ICD-10-CM | POA: Diagnosis present

## 2020-06-17 LAB — CBC WITH DIFFERENTIAL/PLATELET
Abs Immature Granulocytes: 0.09 10*3/uL — ABNORMAL HIGH (ref 0.00–0.07)
Basophils Absolute: 0.1 10*3/uL (ref 0.0–0.1)
Basophils Relative: 1 %
Eosinophils Absolute: 0.2 10*3/uL (ref 0.0–0.5)
Eosinophils Relative: 1 %
HCT: 49.5 % (ref 39.0–52.0)
Hemoglobin: 16.6 g/dL (ref 13.0–17.0)
Immature Granulocytes: 1 %
Lymphocytes Relative: 10 %
Lymphs Abs: 1.3 10*3/uL (ref 0.7–4.0)
MCH: 30.7 pg (ref 26.0–34.0)
MCHC: 33.5 g/dL (ref 30.0–36.0)
MCV: 91.7 fL (ref 80.0–100.0)
Monocytes Absolute: 0.7 10*3/uL (ref 0.1–1.0)
Monocytes Relative: 5 %
Neutro Abs: 10.6 10*3/uL — ABNORMAL HIGH (ref 1.7–7.7)
Neutrophils Relative %: 82 %
Platelets: 217 10*3/uL (ref 150–400)
RBC: 5.4 MIL/uL (ref 4.22–5.81)
RDW: 13.7 % (ref 11.5–15.5)
WBC: 12.9 10*3/uL — ABNORMAL HIGH (ref 4.0–10.5)
nRBC: 0 % (ref 0.0–0.2)

## 2020-06-17 MED ORDER — METHYLPREDNISOLONE SODIUM SUCC 125 MG IJ SOLR
125.0000 mg | Freq: Once | INTRAMUSCULAR | Status: AC
  Administered 2020-06-17: 125 mg via INTRAVENOUS
  Filled 2020-06-17: qty 2

## 2020-06-17 MED ORDER — ALBUTEROL SULFATE HFA 108 (90 BASE) MCG/ACT IN AERS
8.0000 | INHALATION_SPRAY | RESPIRATORY_TRACT | Status: AC
  Administered 2020-06-17: 8 via RESPIRATORY_TRACT
  Filled 2020-06-17: qty 6.7

## 2020-06-17 NOTE — ED Triage Notes (Signed)
Pt c/o sob while eating dinner tonight. Pt presented to ER with  o2 sat of 74% on room air. Pt also c/o neck pain.

## 2020-06-17 NOTE — ED Provider Notes (Signed)
[facility] EMERGENCY DEPARTMENT Provider Note   CSN: [id number] Arrival date & time: 06/17/20  2242     History Chief Complaint  Patient presents with  . Shortness of Breath    Brent Collins is a 70 y.o. male.  Patient presents to the emergency department for evaluation of difficulty breathing.  Patient reports that he started having difficulty breathing tonight.  He has had some mild cough.  No fever.  No chest pain.  Patient does have a history of COPD, reports that he did not have an inhaler to use.  He presented very hypoxic.  Sats improved when he was placed on supplemental oxygen.        Past Medical History:  Diagnosis Date  . Asthma   . COPD (chronic obstructive pulmonary disease) (HCC)   . Inguinal hernia     Patient Active Problem List   Diagnosis Date Noted  . Hypoxia 02/18/2017  . COPD with acute bronchitis (HCC) 02/18/2017  . Benign essential HTN 02/18/2017  . Diastolic dysfunction 02/18/2017  . Dehydration 02/18/2017  . COPD exacerbation (HCC) 07/03/2016    History reviewed. No pertinent surgical history.     No family history on file.  Social History   Tobacco Use  . Smoking status: Never Smoker  . Smokeless tobacco: Never Used  Vaping Use  . Vaping Use: Never used  Substance Use Topics  . Alcohol use: No  . Drug use: No    Home Medications Prior to Admission medications   Medication Sig Start Date End Date Taking? Authorizing Provider  albuterol (PROVENTIL) (2.5 MG/3ML) 0.083% nebulizer solution Take 6 mLs (5 mg total) by nebulization every 6 (six) hours as needed for wheezing or shortness of breath. 04/12/20   Devoria Albe, MD  amLODipine (NORVASC) 10 MG tablet Take 1 tablet (10 mg total) daily by mouth. 02/19/17   Philip Aspen, Limmie Patricia, MD  amoxicillin (AMOXIL) 500 MG capsule Take 1 capsule (500 mg total) by mouth 3 (three) times daily. 04/12/20   Devoria Albe, MD  amoxicillin-clavulanate (AUGMENTIN) 875-125 MG tablet Take 1 tablet  by mouth every 12 (twelve) hours. 11/29/19   Rancour, Jeannett Senior, MD  azithromycin (ZITHROMAX) 250 MG tablet Take 2 po the first day then once a day for the next 4 days. 04/12/20   Devoria Albe, MD  carvedilol (COREG) 6.25 MG tablet Take 1 tablet (6.25 mg total) 2 (two) times daily with a meal by mouth. 02/19/17   Philip Aspen, Limmie Patricia, MD  ibuprofen (ADVIL,MOTRIN) 200 MG tablet Take 200 mg every 6 (six) hours as needed by mouth for moderate pain.    [provider]  isosorbide mononitrate (IMDUR) 30 MG 24 hr tablet Take 1 tablet (30 mg total) daily by mouth. 02/19/17   Philip Aspen, Limmie Patricia, MD  predniSONE (DELTASONE) 20 MG tablet Take 2 p.o. daily for first 4 days and then 1 p.o. daily the next 4 days 04/12/20   Devoria Albe, MD    Allergies    Tomato  Review of Systems   Review of Systems  Respiratory: Positive for cough and shortness of breath.   All other systems reviewed and are negative.   Physical Exam Updated Vital Signs BP 140/68   Pulse (!) 104   Temp 97.8 F (36.6 C) (Oral)   Resp 19   Ht 5\' 8"  (1.727 m)   Wt 56.7 kg   SpO2 100%   BMI 19.01 kg/m   Physical Exam Vitals and nursing note reviewed.  Constitutional:      General: He is not in acute distress.    Appearance: Normal appearance. He is well-developed.  HENT:     Head: Normocephalic and atraumatic.     Right Ear: Hearing normal.     Left Ear: Hearing normal.     Nose: Nose normal.  Eyes:     Conjunctiva/sclera: Conjunctivae normal.     Pupils: Pupils are equal, round, and reactive to light.  Cardiovascular:     Rate and Rhythm: Regular rhythm.     Heart sounds: S1 normal and S2 normal. No murmur heard. No friction rub. No gallop.   Pulmonary:     Effort: Accessory muscle usage present. No respiratory distress.     Breath sounds: Decreased breath sounds and wheezing present.  Chest:     Chest wall: No tenderness.  Abdominal:     General: Bowel sounds are normal.     Palpations: Abdomen  is soft.     Tenderness: There is no abdominal tenderness. There is no guarding or rebound. Negative signs include Murphy's sign and McBurney's sign.     Hernia: No hernia is present.  Musculoskeletal:        General: Normal range of motion.     Cervical back: Normal range of motion and neck supple.  Skin:    General: Skin is warm and dry.     Findings: No rash.  Neurological:     Mental Status: He is alert and oriented to person, place, and time.     GCS: GCS eye subscore is 4. GCS verbal subscore is 5. GCS motor subscore is 6.     Cranial Nerves: No cranial nerve deficit.     Sensory: No sensory deficit.     Coordination: Coordination normal.  Psychiatric:        Speech: Speech normal.        Behavior: Behavior normal.        Thought Content: Thought content normal.     ED Results / Procedures / Treatments   Labs (all labs ordered are listed, but only abnormal results are displayed) Labs Reviewed  CBC WITH DIFFERENTIAL/PLATELET - Abnormal; Notable for the following components:      Result Value   WBC 12.9 (*)    Neutro Abs 10.6 (*)    Abs Immature Granulocytes 0.09 (*)    All other components within normal limits  BASIC METABOLIC PANEL - Abnormal; Notable for the following components:   Glucose, Bld 201 (*)    All other components within normal limits  RESP PANEL BY RT-PCR (FLU A&B, COVID) [id number]  BRAIN NATRIURETIC PEPTIDE  TROPONIN I (HIGH SENSITIVITY)  TROPONIN I (HIGH SENSITIVITY)    EKG EKG Interpretation  Date/Time:  Thursday June 17 2020 22:56:08 EDT Ventricular Rate:  97 PR Interval:    QRS Duration: 101 QT Interval:  340 QTC Calculation: 432 R Axis:   -29 Text Interpretation: Sinus rhythm Biatrial enlargement Borderline left axis deviation Low voltage, precordial leads Abnormal inferior Q waves Probable anterolateral infarct, old Artifact in lead(s) I II III aVR aVL aVF V2 V3 V4 V5 V6 and baseline wander in lead(s) V2 V3 V4 V5 V6 Confirmed by Gilda Crease [phone]) on 06/17/2020 11:05:42 PM   Radiology DG Chest Port 1 View  Result Date: 06/17/2020 CLINICAL DATA:  Shortness of breath. EXAM: PORTABLE CHEST 1 VIEW COMPARISON:  April 12, 2020 FINDINGS: There is no evidence of acute infiltrate, pleural effusion or pneumothorax. The heart size  and mediastinal contours are within normal limits. Degenerative changes are seen involving both shoulders. IMPRESSION: No active disease. Electronically Signed   By: Aram Candela M.D.   On: 06/17/2020 23:34    Procedures Procedures   Medications Ordered in ED Medications  ondansetron (ZOFRAN) 4 MG/2ML injection (  See Procedure Record 06/18/20 0110)  albuterol (VENTOLIN HFA) 108 (90 Base) MCG/ACT inhaler 8 puff (8 puffs Inhalation Given 06/17/20 2339)  methylPREDNISolone sodium succinate (SOLU-MEDROL) 125 mg/2 mL injection 125 mg (125 mg Intravenous Given 06/17/20 2339)  ondansetron (ZOFRAN) injection 4 mg (4 mg Intravenous Given 06/18/20 0101)  albuterol (PROVENTIL,VENTOLIN) solution continuous neb (10 mg/hr Nebulization Given 06/18/20 0129)    ED Course  I have reviewed the triage vital signs and the nursing notes.  Pertinent labs & imaging results that were available during my care of the patient were reviewed by me and considered in my medical decision making (see chart for details).    MDM Rules/Calculators/A&P                          Patient with history of COPD presents to the emergency department with shortness of breath and cough.  Symptoms began this evening.  Patient with significant bronchospasm at arrival.  He apparently has run out of his albuterol at home, did not try any treatments before coming to the ED.  He was hypoxic for EMS but has improved with supplemental oxygen.  His work-up has been unremarkable.  He has been treated with Solu-Medrol and bronchodilator therapy, has significantly improved.  Minimal wheezing now.  He has been on room air for an extended period of  time and his oxygen saturations are 94 to 95%.  Will discharge with continued bronchodilator therapy (given an inhaler to go home) and a prednisone burst.  Given return precautions.  Final Clinical Impression(s) / ED Diagnoses Final diagnoses:  COPD exacerbation (HCC)    Rx / DC Orders ED Discharge Orders    None       Masai Kidd, Canary Brim, MD 06/18/20 [phone]

## 2020-06-18 LAB — BASIC METABOLIC PANEL
Anion gap: 11 (ref 5–15)
BUN: 20 mg/dL (ref 8–23)
CO2: 27 mmol/L (ref 22–32)
Calcium: 9.4 mg/dL (ref 8.9–10.3)
Chloride: 102 mmol/L (ref 98–111)
Creatinine, Ser: 0.85 mg/dL (ref 0.61–1.24)
GFR, Estimated: >60 mL/min (ref >60)
Glucose, Bld: 201 mg/dL — ABNORMAL HIGH (ref 70–99)
Potassium: 4.3 mmol/L (ref 3.5–5.1)
Sodium: 140 mmol/L (ref 135–145)

## 2020-06-18 LAB — RESP PANEL BY RT-PCR (FLU A&B, COVID) ARPGX2
Influenza A by PCR: NEGATIVE
Influenza B by PCR: NEGATIVE
SARS Coronavirus 2 by RT PCR: NEGATIVE

## 2020-06-18 LAB — BRAIN NATRIURETIC PEPTIDE: B Natriuretic Peptide: 46 pg/mL (ref 0.0–100.0)

## 2020-06-18 LAB — TROPONIN I (HIGH SENSITIVITY)
Troponin I (High Sensitivity): 7 ng/L (ref <18)
Troponin I (High Sensitivity): 4 ng/L (ref <18)

## 2020-06-18 MED ORDER — ALBUTEROL SULFATE HFA 108 (90 BASE) MCG/ACT IN AERS: 2.0000 | INHALATION_SPRAY | RESPIRATORY_TRACT | 2 refills | Status: DC | PRN

## 2020-06-18 MED ORDER — ALBUTEROL SULFATE (2.5 MG/3ML) 0.083% IN NEBU: 5.0000 mg | INHALATION_SOLUTION | Freq: Four times a day (QID) | RESPIRATORY_TRACT | 0 refills | Status: DC | PRN

## 2020-06-18 MED ORDER — ALBUTEROL (5 MG/ML) CONTINUOUS INHALATION SOLN
10.0000 mg/h | INHALATION_SOLUTION | Freq: Once | RESPIRATORY_TRACT | Status: AC
  Administered 2020-06-18: 10 mg/h via RESPIRATORY_TRACT
  Filled 2020-06-18: qty 20

## 2020-06-18 MED ORDER — PREDNISONE 20 MG PO TABS
40.0000 mg | ORAL_TABLET | Freq: Every day | ORAL | 0 refills | Status: DC
Start: 2020-06-18 — End: 2021-01-29

## 2020-06-18 MED ORDER — ONDANSETRON HCL 4 MG/2ML IJ SOLN
INTRAMUSCULAR | Status: AC
  Filled 2020-06-18: qty 2

## 2020-06-18 MED ORDER — ALBUTEROL SULFATE (2.5 MG/3ML) 0.083% IN NEBU: 2.5000 mg | INHALATION_SOLUTION | Freq: Four times a day (QID) | RESPIRATORY_TRACT | 12 refills | Status: DC | PRN

## 2020-06-18 MED ORDER — ONDANSETRON HCL 4 MG/2ML IJ SOLN
4.0000 mg | Freq: Once | INTRAMUSCULAR | Status: AC
  Administered 2020-06-18: 4 mg via INTRAVENOUS

## 2021-01-29 ENCOUNTER — Emergency Department (HOSPITAL_COMMUNITY): Payer: Medicare Other

## 2021-01-29 ENCOUNTER — Observation Stay (HOSPITAL_COMMUNITY)
Admission: EM | Admit: 2021-01-29 | Discharge: 2021-01-29 | Disposition: A | Discharge status: Home / Self Care | Payer: Medicare Other | Attending: Internal Medicine | Admitting: Internal Medicine

## 2021-01-29 ENCOUNTER — Encounter (HOSPITAL_COMMUNITY): Payer: Self-pay

## 2021-01-29 ENCOUNTER — Other Ambulatory Visit: Payer: Self-pay

## 2021-01-29 DIAGNOSIS — J45909 Unspecified asthma, uncomplicated: Secondary | ICD-10-CM | POA: Insufficient documentation

## 2021-01-29 DIAGNOSIS — J441 Chronic obstructive pulmonary disease with (acute) exacerbation: Secondary | ICD-10-CM | POA: Diagnosis not present

## 2021-01-29 DIAGNOSIS — R0902 Hypoxemia: Secondary | ICD-10-CM

## 2021-01-29 DIAGNOSIS — Z20822 Contact with and (suspected) exposure to covid-19: Secondary | ICD-10-CM | POA: Diagnosis not present

## 2021-01-29 DIAGNOSIS — I10 Essential (primary) hypertension: Secondary | ICD-10-CM | POA: Insufficient documentation

## 2021-01-29 DIAGNOSIS — Z79899 Other long term (current) drug therapy: Secondary | ICD-10-CM | POA: Insufficient documentation

## 2021-01-29 DIAGNOSIS — R0602 Shortness of breath: Secondary | ICD-10-CM | POA: Diagnosis present

## 2021-01-29 LAB — CBC WITH DIFFERENTIAL/PLATELET
Abs Immature Granulocytes: 0.02 10*3/uL (ref 0.00–0.07)
Basophils Absolute: 0.1 10*3/uL (ref 0.0–0.1)
Basophils Relative: 1 %
Eosinophils Absolute: 0.6 10*3/uL — ABNORMAL HIGH (ref 0.0–0.5)
Eosinophils Relative: 13 %
HCT: 46.7 % (ref 39.0–52.0)
Hemoglobin: 15.8 g/dL (ref 13.0–17.0)
Immature Granulocytes: 0 %
Lymphocytes Relative: 43 %
Lymphs Abs: 2.2 10*3/uL (ref 0.7–4.0)
MCH: 31.1 pg (ref 26.0–34.0)
MCHC: 33.8 g/dL (ref 30.0–36.0)
MCV: 91.9 fL (ref 80.0–100.0)
Monocytes Absolute: 0.4 10*3/uL (ref 0.1–1.0)
Monocytes Relative: 8 %
Neutro Abs: 1.8 10*3/uL (ref 1.7–7.7)
Neutrophils Relative %: 35 %
Platelets: 181 10*3/uL (ref 150–400)
RBC: 5.08 MIL/uL (ref 4.22–5.81)
RDW: 13.3 % (ref 11.5–15.5)
WBC: 5.1 10*3/uL (ref 4.0–10.5)
nRBC: 0 % (ref 0.0–0.2)

## 2021-01-29 LAB — BASIC METABOLIC PANEL
Anion gap: 8 (ref 5–15)
BUN: 18 mg/dL (ref 8–23)
CO2: 30 mmol/L (ref 22–32)
Calcium: 8.9 mg/dL (ref 8.9–10.3)
Chloride: 100 mmol/L (ref 98–111)
Creatinine, Ser: 1.05 mg/dL (ref 0.61–1.24)
GFR, Estimated: >60 mL/min (ref >60)
Glucose, Bld: 171 mg/dL — ABNORMAL HIGH (ref 70–99)
Potassium: 3.7 mmol/L (ref 3.5–5.1)
Sodium: 138 mmol/L (ref 135–145)

## 2021-01-29 LAB — BLOOD GAS, VENOUS
Acid-Base Excess: 6.4 mmol/L — ABNORMAL HIGH (ref 0.0–2.0)
Bicarbonate: 26.3 mmol/L (ref 20.0–28.0)
Drawn by: 1580
FIO2: 21
O2 Saturation: 36 %
Patient temperature: 36.6
pCO2, Ven: 64.1 mmHg — ABNORMAL HIGH (ref 44.0–60.0)
pH, Ven: 7.321 (ref 7.250–7.430)
pO2, Ven: <31 mmHg — CL (ref 32.0–45.0)

## 2021-01-29 LAB — RESP PANEL BY RT-PCR (FLU A&B, COVID) ARPGX2
Influenza A by PCR: NEGATIVE
Influenza B by PCR: NEGATIVE
SARS Coronavirus 2 by RT PCR: NEGATIVE

## 2021-01-29 LAB — TROPONIN I (HIGH SENSITIVITY)
Troponin I (High Sensitivity): 5 ng/L (ref <18)
Troponin I (High Sensitivity): 6 ng/L (ref <18)

## 2021-01-29 MED ORDER — ONDANSETRON HCL 4 MG/2ML IJ SOLN: 4.0000 mg | Freq: Four times a day (QID) | INTRAMUSCULAR | Status: DC | PRN

## 2021-01-29 MED ORDER — ALBUTEROL SULFATE (2.5 MG/3ML) 0.083% IN NEBU
2.5000 mg | INHALATION_SOLUTION | RESPIRATORY_TRACT | Status: DC | PRN
  Administered 2021-01-29: 2.5 mg via RESPIRATORY_TRACT
  Filled 2021-01-29: qty 3

## 2021-01-29 MED ORDER — ACETAMINOPHEN 650 MG RE SUPP: 650.0000 mg | Freq: Four times a day (QID) | RECTAL | Status: DC | PRN

## 2021-01-29 MED ORDER — AZITHROMYCIN 250 MG PO TABS: ORAL_TABLET | ORAL | 0 refills | Status: DC

## 2021-01-29 MED ORDER — PREDNISONE 20 MG PO TABS: 40.0000 mg | ORAL_TABLET | Freq: Every day | ORAL | Status: DC

## 2021-01-29 MED ORDER — PREDNISONE 10 MG PO TABS: ORAL_TABLET | ORAL | 0 refills | Status: DC

## 2021-01-29 MED ORDER — BUDESONIDE 0.5 MG/2ML IN SUSP: 2.0000 mg | Freq: Four times a day (QID) | RESPIRATORY_TRACT | Status: DC

## 2021-01-29 MED ORDER — BUDESONIDE 0.25 MG/2ML IN SUSP
0.2500 mg | Freq: Two times a day (BID) | RESPIRATORY_TRACT | Status: DC
  Administered 2021-01-29: 0.25 mg via RESPIRATORY_TRACT
  Filled 2021-01-29: qty 2

## 2021-01-29 MED ORDER — GUAIFENESIN ER 600 MG PO TB12: 600.0000 mg | ORAL_TABLET | Freq: Two times a day (BID) | ORAL | 0 refills | Status: DC

## 2021-01-29 MED ORDER — ALBUTEROL SULFATE HFA 108 (90 BASE) MCG/ACT IN AERS: 2.0000 | INHALATION_SPRAY | RESPIRATORY_TRACT | 2 refills | Status: DC | PRN

## 2021-01-29 MED ORDER — CARVEDILOL 3.125 MG PO TABS: 6.2500 mg | ORAL_TABLET | Freq: Two times a day (BID) | ORAL | Status: DC

## 2021-01-29 MED ORDER — METHYLPREDNISOLONE SODIUM SUCC 40 MG IJ SOLR
40.0000 mg | Freq: Two times a day (BID) | INTRAMUSCULAR | Status: DC
  Administered 2021-01-29: 40 mg via INTRAVENOUS
  Filled 2021-01-29: qty 1

## 2021-01-29 MED ORDER — ONDANSETRON HCL 4 MG PO TABS: 4.0000 mg | ORAL_TABLET | Freq: Four times a day (QID) | ORAL | Status: DC | PRN

## 2021-01-29 MED ORDER — ACETAMINOPHEN 325 MG PO TABS: 650.0000 mg | ORAL_TABLET | Freq: Four times a day (QID) | ORAL | Status: DC | PRN

## 2021-01-29 MED ORDER — HYDROCOD POLST-CPM POLST ER 10-8 MG/5ML PO SUER: 5.0000 mL | Freq: Two times a day (BID) | ORAL | 0 refills | Status: DC | PRN

## 2021-01-29 MED ORDER — ISOSORBIDE MONONITRATE ER 30 MG PO TB24
30.0000 mg | ORAL_TABLET | Freq: Every day | ORAL | Status: DC
  Administered 2021-01-29: 30 mg via ORAL
  Filled 2021-01-29: qty 1

## 2021-01-29 MED ORDER — HYDROCOD POLST-CPM POLST ER 10-8 MG/5ML PO SUER
5.0000 mL | Freq: Two times a day (BID) | ORAL | Status: DC | PRN
  Administered 2021-01-29: 5 mL via ORAL
  Filled 2021-01-29: qty 5

## 2021-01-29 MED ORDER — IPRATROPIUM-ALBUTEROL 0.5-2.5 (3) MG/3ML IN SOLN: 3.0000 mL | Freq: Four times a day (QID) | RESPIRATORY_TRACT | Status: DC

## 2021-01-29 MED ORDER — IPRATROPIUM-ALBUTEROL 0.5-2.5 (3) MG/3ML IN SOLN
3.0000 mL | Freq: Once | RESPIRATORY_TRACT | Status: AC
  Administered 2021-01-29: 3 mL via RESPIRATORY_TRACT
  Filled 2021-01-29: qty 3

## 2021-01-29 MED ORDER — AMLODIPINE BESYLATE 5 MG PO TABS
10.0000 mg | ORAL_TABLET | Freq: Every day | ORAL | Status: DC
  Administered 2021-01-29: 10 mg via ORAL
  Filled 2021-01-29: qty 2

## 2021-01-29 MED ORDER — CEFTRIAXONE SODIUM 1 G IJ SOLR
1.0000 g | INTRAMUSCULAR | Status: DC
  Administered 2021-01-29: 1 g via INTRAVENOUS
  Filled 2021-01-29: qty 10

## 2021-01-29 MED ORDER — ENOXAPARIN SODIUM 40 MG/0.4ML IJ SOSY: 40.0000 mg | PREFILLED_SYRINGE | INTRAMUSCULAR | Status: DC

## 2021-01-29 MED ORDER — ALBUTEROL SULFATE (2.5 MG/3ML) 0.083% IN NEBU: 2.5000 mg | INHALATION_SOLUTION | Freq: Four times a day (QID) | RESPIRATORY_TRACT | 12 refills | Status: DC | PRN

## 2021-01-29 MED ORDER — GUAIFENESIN ER 600 MG PO TB12
600.0000 mg | ORAL_TABLET | Freq: Two times a day (BID) | ORAL | Status: DC
  Administered 2021-01-29: 600 mg via ORAL
  Filled 2021-01-29: qty 1

## 2021-01-29 MED ORDER — SODIUM CHLORIDE 0.9 % IV SOLN
100.0000 mg | Freq: Once | INTRAVENOUS | Status: DC
  Filled 2021-01-29: qty 100

## 2021-01-29 NOTE — Consult Note (Signed)
Medical Consultation   Brent Collins  YKD:[id number]  DOB: 17-Feb-1951  DOA: 01/29/2021  PCP: Patient, No Pcp Per (Inactive)   Outpatient Specialists:    Requesting physician: Dr. Wallace Cullens, EDP  Reason for consultation: Shortness of breath   History of Present Illness: Brent Collins is an 70 y.o. male with a history of COPD, presents to the hospital with worsening shortness of breath and wheezing.  He was found to have COPD exacerbation.  Chest x-ray did not show any evidence of pneumonia.  The patient did receive Solu-Medrol on route by EMS.  He was noted to be hypoxic on EMS arrival and was initially placed on nonrebreather mask.  On arrival to the emergency room, he was quickly transitioned to nasal cannula 2 L.  With bronchodilator treatments, his respiratory status did begin to improve.  He was subsequently ambulated in the emergency room on room air, but was noted to have transient hypoxia.  Admission was requested.      Review of Systems:  ROS As per HPI otherwise 10 point review of systems negative.    Past Medical History: Past Medical History:  Diagnosis Date   Asthma    COPD (chronic obstructive pulmonary disease) (HCC)    Inguinal hernia     Past Surgical History: History reviewed. No pertinent surgical history.   Allergies:   Allergies  Allergen Reactions   Tomato Shortness Of Breath and Nausea And Vomiting     Social History:  reports that he has never smoked. He has never used smokeless tobacco. He reports that he does not drink alcohol and does not use drugs.   Family History: History reviewed. No pertinent family history.     Physical Exam: Vitals:   01/29/21 0830 01/29/21 0855 01/29/21 1300 01/29/21 1610  BP:  124/79 110/80 137/77  Pulse: 84 88 95 92  Resp: 20 20 17 17   Temp:    97.9 F (36.6 C)  TempSrc:    Oral  SpO2: 97% 96% 95% 97%  Weight:      Height:        Constitutional: Alert and awake, oriented x3,  not in any acute distress. Eyes: PERLA, EOMI, irises appear normal, anicteric sclera,  ENMT: external ears and nose appear normal            Lips appears normal, oropharynx mucosa, tongue, posterior pharynx appear normal  Neck: neck appears normal, no masses, normal ROM, no thyromegaly, no JVD  CVS: S1-S2 clear, no murmur rubs or gallops, no LE edema, normal pedal pulses  Respiratory:  clear to auscultation bilaterally, no wheezing, rales or rhonchi. Respiratory effort normal. No accessory muscle use.  Abdomen: soft nontender, nondistended, normal bowel sounds, no hepatosplenomegaly, no hernias  Musculoskeletal: : no cyanosis, clubbing or edema noted bilaterally Neuro: Cranial nerves II-XII intact, strength, sensation, reflexes Psych: judgement and insight appear normal, stable mood and affect, mental status Skin: no rashes or lesions or ulcers, no induration or nodules   Data reviewed:  I have personally reviewed following labs and imaging studies Labs:  CBC: Recent Labs  Lab 01/29/21 0649  WBC 5.1  NEUTROABS 1.8  HGB 15.8  HCT 46.7  MCV 91.9  PLT 181    Basic Metabolic Panel: Recent Labs  Lab 01/29/21 0649  NA 138  K 3.7  CL 100  CO2 30  GLUCOSE 171*  BUN 18  CREATININE 1.05  CALCIUM 8.9  GFR Estimated Creatinine Clearance: 58.2 mL/min (by C-G formula based on SCr of 1.05 mg/dL). Liver Function Tests: No results for input(s): AST, ALT, ALKPHOS, BILITOT, PROT, ALBUMIN in the last 168 hours. No results for input(s): LIPASE, AMYLASE in the last 168 hours. No results for input(s): AMMONIA in the last 168 hours. Coagulation profile No results for input(s): INR, PROTIME in the last 168 hours.  Cardiac Enzymes: No results for input(s): CKTOTAL, CKMB, CKMBINDEX, TROPONINI in the last 168 hours. BNP: Invalid input(s): POCBNP CBG: No results for input(s): GLUCAP in the last 168 hours. D-Dimer No results for input(s): DDIMER in the last 72 hours. Hgb A1c No results  for input(s): HGBA1C in the last 72 hours. Lipid Profile No results for input(s): CHOL, HDL, LDLCALC, TRIG, CHOLHDL, LDLDIRECT in the last 72 hours. Thyroid function studies No results for input(s): TSH, T4TOTAL, T3FREE, THYROIDAB in the last 72 hours.  Invalid input(s): FREET3 Anemia work up No results for input(s): VITAMINB12, FOLATE, FERRITIN, TIBC, IRON, RETICCTPCT in the last 72 hours. Urinalysis No results found for: COLORURINE, APPEARANCEUR, LABSPEC, PHURINE, GLUCOSEU, HGBUR, BILIRUBINUR, KETONESUR, PROTEINUR, UROBILINOGEN, NITRITE, LEUKOCYTESUR   Microbiology Recent Results (from the past 240 hour(s))  Resp Panel by RT-PCR (Flu A&B, Covid) Nasopharyngeal Swab     Status: None   Collection Time: 01/29/21  6:42 AM   Specimen: Nasopharyngeal Swab; Nasopharyngeal(NP) swabs in vial transport medium  Result Value Ref Range Status   SARS Coronavirus 2 by RT PCR NEGATIVE NEGATIVE Final    Comment: (NOTE) SARS-CoV-2 target nucleic acids are NOT DETECTED.  The SARS-CoV-2 RNA is generally detectable in upper respiratory specimens during the acute phase of infection. The lowest concentration of SARS-CoV-2 viral copies this assay can detect is 138 copies/mL. A negative result does not preclude SARS-Cov-2 infection and should not be used as the sole basis for treatment or other patient management decisions. A negative result may occur with  improper specimen collection/handling, submission of specimen other than nasopharyngeal swab, presence of viral mutation(s) within the areas targeted by this assay, and inadequate number of viral copies(<138 copies/mL). A negative result must be combined with clinical observations, patient history, and epidemiological information. The expected result is Negative.  Fact Sheet for Patients:  BloggerCourse.com  Fact Sheet for Healthcare Providers:  SeriousBroker.it  This test is no t yet approved or  cleared by the [place] FDA and  has been authorized for detection and/or diagnosis of SARS-CoV-2 by FDA under an Emergency Use Authorization (EUA). This EUA will remain  in effect (meaning this test can be used) for the duration of the COVID-19 declaration under Section 564(b)(1) of the Act, 21 U.S.C.section 360bbb-3(b)(1), unless the authorization is terminated  or revoked sooner.       Influenza A by PCR NEGATIVE NEGATIVE Final   Influenza B by PCR NEGATIVE NEGATIVE Final    Comment: (NOTE) The Xpert Xpress SARS-CoV-2/FLU/RSV plus assay is intended as an aid in the diagnosis of influenza from Nasopharyngeal swab specimens and should not be used as a sole basis for treatment. Nasal washings and aspirates are unacceptable for Xpert Xpress SARS-CoV-2/FLU/RSV testing.  Fact Sheet for Patients: BloggerCourse.com  Fact Sheet for Healthcare Providers: SeriousBroker.it  This test is not yet approved or cleared by the [place] FDA and has been authorized for detection and/or diagnosis of SARS-CoV-2 by FDA under an Emergency Use Authorization (EUA). This EUA will remain in effect (meaning this test can be used) for the duration of the COVID-19 declaration under Section  564(b)(1) of the Act, 21 U.S.C. section 360bbb-3(b)(1), unless the authorization is terminated or revoked.  Performed at [facility], [address], [place], [place] [address]        Inpatient Medications:   Scheduled Meds:  amLODipine  10 mg Oral Daily   budesonide (PULMICORT) nebulizer solution  0.25 mg Nebulization BID   carvedilol  6.25 mg Oral BID WC   enoxaparin (LOVENOX) injection  40 mg Subcutaneous Q24H   guaiFENesin  600 mg Oral BID   ipratropium-albuterol  3 mL Nebulization Q6H   isosorbide mononitrate  30 mg Oral Daily   methylPREDNISolone (SOLU-MEDROL) injection  40 mg Intravenous Q12H   Followed by   Melene Muller ON 01/30/2021] predniSONE   40 mg Oral Q breakfast   Continuous Infusions:  cefTRIAXone (ROCEPHIN)  IV Stopped (01/29/21 1215)     Radiological Exams on Admission: DG Chest Portable 1 View  Result Date: 01/29/2021 CLINICAL DATA:  Shortness of breath EXAM: PORTABLE CHEST 1 VIEW COMPARISON:  06/17/2020 FINDINGS: The heart size and mediastinal contours are within normal limits. There is no edema, consolidation, effusion, or pneumothorax. The visualized skeletal structures are unremarkable. 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 IMPRESSION: Stable exam.  No acute finding Electronically Signed   By: Tiburcio Pea M.D.   On: 01/29/2021 07:12    Impression/Recommendations Active Problems:   COPD exacerbation (HCC)  COPD exacerbation.  Appears to have rapidly improved with steroids and bronchodilators.  Patient reports that he was using a nebulizer at home, but recently ran out of this medication.  He is no longer smoking.  The patient was ambulated in the emergency room and initially became hypoxic.  After further bronchodilator therapy and steroids, he was reassessed and was able to ambulate without any hypoxia or shortness of breath.  He appears to be comfortable on room air.  Overall coughing is also improved.  At this point, I think it would be reasonable to have the patient discharged with a prednisone taper, refill albuterol neb treatments, provide a course of antibiotics and continue mucolytic's.  Since he is breathing comfortably on room air, he does not meet criteria for admission.  He can follow-up with his primary care physician as an outpatient.   Thank you for this consultation.    Time Spent: 40 minutes  Erick Blinks M.D. [facility] 01/29/2021, 8:25 PM

## 2021-01-29 NOTE — ED Triage Notes (Signed)
Pt arrived via [facility] c/o respiratory distress. EMS report Pt not on oxygen at home and was found to be at 57% O2 on room air at home. Ems ADMINISTERED 125mg  and 5mg  ASLbuterol PTA. Pt now on 7L Non-Rebreather with 100% O2 Sats.

## 2021-01-29 NOTE — ED Provider Notes (Signed)
[facility] EMERGENCY DEPARTMENT Provider Note   CSN: [id number] Arrival date & time: 01/29/21  2878     History Chief Complaint  Patient presents with   Shortness of Breath    Brent Collins is a 70 y.o. male.  This is a 70 y.o. male with significant medical history as below, including copd who presents to the ED with complaint of dib, subj fever, cp  Location:  sub sternal Duration:  2 hours pta Onset:  sudden Timing:  constant Description:  aching, heaviness, improved after receiving oxygen Severity:  moderate Exacerbating/Alleviating Factors:  worse with exertion Associated Symptoms: productive cough with white sputum, dyspnea on exertion, subjective fever/chills Pertinent Negatives:  no nausea/ vomiting, change to bowel/bladder fxn, no recent travel, no hx dvt/pe  Context: patient woke up around 0400 with sudden onset of dyspnea, cp, unable to catch his breath. Does not wear oxygen at home. He has history of COPD, denies prior tobacco use.   Per EMS patient was hypoxic at home in the 50% range on room air. He does not wear oxygen at home. He reports that he also ran out of his COPD medication.    The history is provided by the patient and the EMS personnel. No language interpreter was used.  Shortness of Breath Associated symptoms: chest pain, cough and fever   Associated symptoms: no abdominal pain, no headaches, no rash and no vomiting       Past Medical History:  Diagnosis Date   Asthma    COPD (chronic obstructive pulmonary disease) (HCC)    Inguinal hernia     Patient Active Problem List   Diagnosis Date Noted   Hypoxia 02/18/2017   COPD with acute bronchitis (HCC) 02/18/2017   Benign essential HTN 02/18/2017   Diastolic dysfunction 02/18/2017   Dehydration 02/18/2017   COPD exacerbation (HCC) 07/03/2016    History reviewed. No pertinent surgical history.     History reviewed. No pertinent family history.  Social History   Tobacco Use    Smoking status: Never   Smokeless tobacco: Never  Vaping Use   Vaping Use: Never used  Substance Use Topics   Alcohol use: No   Drug use: No    Home Medications Prior to Admission medications   Medication Sig Start Date End Date Taking? Authorizing Provider  albuterol (PROVENTIL) (2.5 MG/3ML) 0.083% nebulizer solution Take 6 mLs (5 mg total) by nebulization every 6 (six) hours as needed for wheezing or shortness of breath. 06/18/20   Gilda Crease, MD  albuterol (PROVENTIL) (2.5 MG/3ML) 0.083% nebulizer solution Take 3 mLs (2.5 mg total) by nebulization every 6 (six) hours as needed for wheezing or shortness of breath. 06/18/20   Gilda Crease, MD  albuterol (VENTOLIN HFA) 108 (90 Base) MCG/ACT inhaler Inhale 2 puffs into the lungs every 4 (four) hours as needed for wheezing or shortness of breath. 06/18/20   Gilda Crease, MD  amLODipine (NORVASC) 10 MG tablet Take 1 tablet (10 mg total) daily by mouth. 02/19/17   Philip Aspen, Limmie Patricia, MD  amoxicillin (AMOXIL) 500 MG capsule Take 1 capsule (500 mg total) by mouth 3 (three) times daily. 04/12/20   Devoria Albe, MD  amoxicillin-clavulanate (AUGMENTIN) 875-125 MG tablet Take 1 tablet by mouth every 12 (twelve) hours. 11/29/19   Rancour, Jeannett Senior, MD  azithromycin (ZITHROMAX) 250 MG tablet Take 2 po the first day then once a day for the next 4 days. 04/12/20   Devoria Albe, MD  carvedilol (COREG)  6.25 MG tablet Take 1 tablet (6.25 mg total) 2 (two) times daily with a meal by mouth. 02/19/17   Philip Aspen, Limmie Patricia, MD  ibuprofen (ADVIL,MOTRIN) 200 MG tablet Take 200 mg every 6 (six) hours as needed by mouth for moderate pain.    [provider]  isosorbide mononitrate (IMDUR) 30 MG 24 hr tablet Take 1 tablet (30 mg total) daily by mouth. 02/19/17   Philip Aspen, Limmie Patricia, MD  predniSONE (DELTASONE) 20 MG tablet Take 2 tablets (40 mg total) by mouth daily with breakfast. 06/18/20   Pollina, Canary Brim, MD     Allergies    Tomato  Review of Systems   Review of Systems  Constitutional:  Positive for chills and fever.  HENT:  Negative for facial swelling and trouble swallowing.   Eyes:  Negative for photophobia and visual disturbance.  Respiratory:  Positive for cough and shortness of breath.   Cardiovascular:  Positive for chest pain. Negative for palpitations.  Gastrointestinal:  Negative for abdominal pain, nausea and vomiting.  Endocrine: Negative for polydipsia and polyuria.  Genitourinary:  Negative for difficulty urinating and hematuria.  Musculoskeletal:  Negative for gait problem and joint swelling.  Skin:  Negative for pallor and rash.  Neurological:  Negative for syncope and headaches.  Psychiatric/Behavioral:  Negative for agitation and confusion.    Physical Exam Updated Vital Signs BP 131/78   Pulse 84   Temp 97.8 F (36.6 C) (Oral)   Resp 20   Ht 5\' 6"  (1.676 m)   Wt 72.6 kg   SpO2 97%   BMI 25.82 kg/m   Physical Exam Vitals and nursing note reviewed.  Constitutional:      General: He is not in acute distress.    Appearance: He is not ill-appearing.     Comments: Frail appearing   HENT:     Head: Normocephalic and atraumatic.     Right Ear: External ear normal.     Left Ear: External ear normal.     Mouth/Throat:     Mouth: Mucous membranes are moist.  Eyes:     General: No scleral icterus. Cardiovascular:     Rate and Rhythm: Normal rate and regular rhythm.     Pulses: Normal pulses.     Heart sounds: Normal heart sounds.  Pulmonary:     Effort: Pulmonary effort is normal. Tachypnea present. No respiratory distress.     Breath sounds: Decreased breath sounds and wheezing present.  Abdominal:     General: Abdomen is flat.     Palpations: Abdomen is soft.     Tenderness: There is no abdominal tenderness.  Musculoskeletal:        General: Normal range of motion.     Cervical back: Normal range of motion.     Right lower leg: No edema.     Left  lower leg: No edema.  Skin:    General: Skin is warm and dry.     Capillary Refill: Capillary refill takes less than 2 seconds.  Neurological:     Mental Status: He is alert and oriented to person, place, and time.  Psychiatric:        Mood and Affect: Mood normal.        Behavior: Behavior normal.    ED Results / Procedures / Treatments   Labs (all labs ordered are listed, but only abnormal results are displayed) Labs Reviewed  BASIC METABOLIC PANEL - Abnormal; Notable for the following components:  Result Value   Glucose, Bld 171 (*)    All other components within normal limits  CBC WITH DIFFERENTIAL/PLATELET - Abnormal; Notable for the following components:   Eosinophils Absolute 0.6 (*)    All other components within normal limits  BLOOD GAS, VENOUS - Abnormal; Notable for the following components:   pCO2, Ven 64.1 (*)    pO2, Ven <31.0 (*)    Acid-Base Excess 6.4 (*)    All other components within normal limits  RESP PANEL BY RT-PCR (FLU A&B, COVID) ARPGX2  TROPONIN I (HIGH SENSITIVITY)  TROPONIN I (HIGH SENSITIVITY)    EKG EKG Interpretation  Date/Time:  Saturday January 29 2021 06:36:11 EDT Ventricular Rate:  82 PR Interval:  198 QRS Duration: 112 QT Interval:  388 QTC Calculation: 454 R Axis:   3 Text Interpretation: Sinus rhythm Ventricular premature complex Interpretation limited secondary to artifact Confirmed by Tanda Rockers ([id number]) on 01/29/2021 7:19:13 AM  Radiology DG Chest Portable 1 View  Result Date: 01/29/2021 CLINICAL DATA:  Shortness of breath EXAM: PORTABLE CHEST 1 VIEW COMPARISON:  06/17/2020 FINDINGS: The heart size and mediastinal contours are within normal limits. There is no edema, consolidation, effusion, or pneumothorax. The visualized skeletal structures are unremarkable. 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 IMPRESSION: Stable exam.  No acute finding Electronically Signed   By: Tiburcio Pea M.D.   On: 01/29/2021 07:12     Procedures .Critical Care Performed by: Sloan Leiter, DO Authorized by: Sloan Leiter, DO   Critical care provider statement:    Critical care time (minutes):  32   Critical care time was exclusive of:  Separately billable procedures and treating other patients   Critical care was necessary to treat or prevent imminent or life-threatening deterioration of the following conditions:  Respiratory failure   Critical care was time spent personally by me on the following activities:  Ordering and performing treatments and interventions, ordering and review of laboratory studies, ordering and review of radiographic studies, pulse oximetry, re-evaluation of patient's condition, review of old charts, obtaining history from patient or surrogate, examination of patient, evaluation of patient's response to treatment and development of treatment plan with patient or surrogate   Care discussed with: admitting provider     Medications Ordered in ED Medications  ipratropium-albuterol (DUONEB) 0.5-2.5 (3) MG/3ML nebulizer solution 3 mL (has no administration in time range)  doxycycline (VIBRAMYCIN) 100 mg in sodium chloride 0.9 % 250 mL IVPB (has no administration in time range)    ED Course  I have reviewed the triage vital signs and the nursing notes.  Pertinent labs & imaging results that were available during my care of the patient were reviewed by me and considered in my medical decision making (see chart for details).    MDM Rules/Calculators/A&P                            CC: dib, cp  This patient complains of symptoms as above; this involves an extensive number of treatment options and is a complaint that carries with it a high risk of complications and morbidity. Vital signs were reviewed. Serious etiologies considered.  Pt hypoxic prior to arrival. Improved on NRB, now transitioned to [place]. On room air at rest he is comfortable however with ambulation his pulse ox will drop to the mid  80's%   Record review:  Previous records obtained and reviewed   Additional history obtained from EMS  Work  up as above, notable for:  Labs & imaging results that were available during my care of the patient were reviewed by me and considered in my medical decision making.   VBG with hypercapnia that does appear compensated as normal pH  I ordered imaging studies which included CXR and I independently visualized and interpreted imaging which showed no acute changes  Management: Give steroids by ems and albuterol, will give doxy/addtn'l duoneb in ED as patient continues to have wheeze b/l and hypoxic on exertion. At rest resp status is stable. He will intermittently d/c the nasal cannula. With continued exertion he does become scientifically dyspneic.   Reassessment:  Patient does report feeling somewhat better after supplemental oxygen and breathing treatments. Given patient hypoxic with ambulation, do not feel he is safe to be discharged home. Recommend admission, pt is agreeable.       This chart was dictated using voice recognition software.  Despite best efforts to proofread,  errors can occur which can change the documentation meaning.  Final Clinical Impression(s) / ED Diagnoses Final diagnoses:  COPD exacerbation (HCC)  Hypoxia    Rx / DC Orders ED Discharge Orders     None        Sloan Leiter, DO 01/29/21 [phone]

## 2021-01-29 NOTE — ED Notes (Signed)
Pt ambulatory to waiting room. Pt verbalized understanding of discharge instructions.   

## 2021-01-29 NOTE — ED Notes (Signed)
Pt ambulated around nurses station. Pts O2 started at 96% and remained at 96% throughout the walk.

## 2021-11-16 ENCOUNTER — Emergency Department (HOSPITAL_COMMUNITY)
Admission: EM | Admit: 2021-11-16 | Discharge: 2021-11-17 | Disposition: A | Discharge status: Home / Self Care | Payer: Medicare Other | Attending: Emergency Medicine | Admitting: Emergency Medicine

## 2021-11-16 DIAGNOSIS — Z20822 Contact with and (suspected) exposure to covid-19: Secondary | ICD-10-CM | POA: Diagnosis not present

## 2021-11-16 DIAGNOSIS — Z7982 Long term (current) use of aspirin: Secondary | ICD-10-CM | POA: Diagnosis not present

## 2021-11-16 DIAGNOSIS — Z7951 Long term (current) use of inhaled steroids: Secondary | ICD-10-CM | POA: Insufficient documentation

## 2021-11-16 DIAGNOSIS — J441 Chronic obstructive pulmonary disease with (acute) exacerbation: Secondary | ICD-10-CM | POA: Insufficient documentation

## 2021-11-16 DIAGNOSIS — R0602 Shortness of breath: Secondary | ICD-10-CM | POA: Diagnosis present

## 2021-11-16 NOTE — ED Triage Notes (Signed)
Patient states gave self several breathing tx's yesterday because of SOB, none today Patient called EMS O2 Saturation 84% upon arrival  EMS placed on 18ml non-rebreather. Audible wheezing noted upon arrival placed on n/c 4lpm O2 sat 98%. Patient is calm alert and able to answer questions.

## 2021-11-16 NOTE — ED Provider Notes (Signed)
[facility] EMERGENCY DEPARTMENT Provider Note   CSN: [id number] Arrival date & time: 11/16/21  2344     History {Add pertinent medical, surgical, social history, OB history to HPI:1} No chief complaint on file.   Brent Collins is a 71 y.o. male.  Patient presents to the emergency department for evaluation of shortness of breath.  Patient reports that symptoms began around 6 PM.  Patient does report a previous history of COPD, normally uses his inhaler every day.  He ran out of his albuterol inhaler yesterday.  Patient comes to the ER by ambulance.  EMS has administered bronchodilator therapy and he reports that he feels improved.       Home Medications Prior to Admission medications   Medication Sig Start Date End Date Taking? Authorizing Provider  albuterol (PROVENTIL) (2.5 MG/3ML) 0.083% nebulizer solution Take 3 mLs (2.5 mg total) by nebulization every 6 (six) hours as needed for wheezing or shortness of breath. 01/29/21   Erick Blinks, MD  albuterol (VENTOLIN HFA) 108 (90 Base) MCG/ACT inhaler Inhale 2 puffs into the lungs every 4 (four) hours as needed for wheezing or shortness of breath. 01/29/21   Erick Blinks, MD  amLODipine (NORVASC) 10 MG tablet Take 1 tablet (10 mg total) daily by mouth. Patient not taking: No sig reported 02/19/17   Philip Aspen, Limmie Patricia, MD  Aspirin-Salicylamide-Caffeine [facility] HEADACHE POWDER PO) Take 1 packet by mouth every 6 (six) hours as needed.    [provider]  azithromycin (ZITHROMAX) 250 MG tablet Take 2 po the first day then once a day for the next 4 days. 01/29/21   Erick Blinks, MD  chlorpheniramine-HYDROcodone (TUSSIONEX) 10-8 MG/5ML SUER Take 5 mLs by mouth every 12 (twelve) hours as needed for cough. 01/29/21   Erick Blinks, MD  guaiFENesin (MUCINEX) 600 MG 12 hr tablet Take 1 tablet (600 mg total) by mouth 2 (two) times daily. 01/29/21   Erick Blinks, MD  isosorbide mononitrate (IMDUR) 30 MG 24 hr tablet  Take 1 tablet (30 mg total) daily by mouth. Patient not taking: No sig reported 02/19/17   Philip Aspen, Limmie Patricia, MD  predniSONE (DELTASONE) 10 MG tablet Take 40mg  po daily for 2 days then 30mg  daily for 2 days then 20mg  daily for 2 days then 10mg  daily for 2 days then stop 01/29/21   , MD      Allergies    Tomato    Review of Systems   Review of Systems  Physical Exam Updated Vital Signs There were no vitals taken for this visit. Physical Exam Vitals and nursing note reviewed.  Constitutional:      General: He is not in acute distress.    Appearance: He is well-developed.  HENT:     Head: Normocephalic and atraumatic.     Mouth/Throat:     Mouth: Mucous membranes are moist.  Eyes:     General: Vision grossly intact. Gaze aligned appropriately.     Extraocular Movements: Extraocular movements intact.     Conjunctiva/sclera: Conjunctivae normal.  Cardiovascular:     Rate and Rhythm: Normal rate and regular rhythm.     Pulses: Normal pulses.     Heart sounds: Normal heart sounds, S1 normal and S2 normal. No murmur heard.    No friction rub. No gallop.  Pulmonary:     Effort: Pulmonary effort is normal. No respiratory distress.     Breath sounds: Decreased air movement present. Decreased breath sounds and wheezing present.  Abdominal:  Palpations: Abdomen is soft.     Tenderness: There is no abdominal tenderness. There is no guarding or rebound.     Hernia: No hernia is present.  Musculoskeletal:        General: No swelling.     Cervical back: Full passive range of motion without pain, normal range of motion and neck supple. No pain with movement, spinous process tenderness or muscular tenderness. Normal range of motion.     Right lower leg: No edema.     Left lower leg: No edema.  Skin:    General: Skin is warm and dry.     Capillary Refill: Capillary refill takes less than 2 seconds.     Findings: No ecchymosis, erythema, lesion or wound.   Neurological:     Mental Status: He is alert and oriented to person, place, and time.     GCS: GCS eye subscore is 4. GCS verbal subscore is 5. GCS motor subscore is 6.     Cranial Nerves: Cranial nerves 2-12 are intact.     Sensory: Sensation is intact.     Motor: Motor function is intact. No weakness or abnormal muscle tone.     Coordination: Coordination is intact.  Psychiatric:        Mood and Affect: Mood normal.        Speech: Speech normal.        Behavior: Behavior normal.     ED Results / Procedures / Treatments   Labs (all labs ordered are listed, but only abnormal results are displayed) Labs Reviewed - No data to display  EKG None  Radiology No results found.  Procedures Procedures  {Document cardiac monitor, telemetry assessment procedure when appropriate:1}  Medications Ordered in ED Medications - No data to display  ED Course/ Medical Decision Making/ A&P                           Medical Decision Making  ***  {Document critical care time when appropriate:1} {Document review of labs and clinical decision tools ie heart score, Chads2Vasc2 etc:1}  {Document your independent review of radiology images, and any outside records:1} {Document your discussion with family members, caretakers, and with consultants:1} {Document social determinants of health affecting pt's care:1} {Document your decision making why or why not admission, treatments were needed:1} Final Clinical Impression(s) / ED Diagnoses Final diagnoses:  None    Rx / DC Orders ED Discharge Orders     None

## 2021-11-17 ENCOUNTER — Emergency Department (HOSPITAL_COMMUNITY): Payer: Medicare Other

## 2021-11-17 LAB — SARS CORONAVIRUS 2 BY RT PCR: SARS Coronavirus 2 by RT PCR: NEGATIVE

## 2021-11-17 LAB — CBC WITH DIFFERENTIAL/PLATELET
Abs Immature Granulocytes: 0.02 10*3/uL (ref 0.00–0.07)
Basophils Absolute: 0.1 10*3/uL (ref 0.0–0.1)
Basophils Relative: 1 %
Eosinophils Absolute: 0.5 10*3/uL (ref 0.0–0.5)
Eosinophils Relative: 5 %
HCT: 45.7 % (ref 39.0–52.0)
Hemoglobin: 15.7 g/dL (ref 13.0–17.0)
Immature Granulocytes: 0 %
Lymphocytes Relative: 18 %
Lymphs Abs: 1.8 10*3/uL (ref 0.7–4.0)
MCH: 31 pg (ref 26.0–34.0)
MCHC: 34.4 g/dL (ref 30.0–36.0)
MCV: 90.1 fL (ref 80.0–100.0)
Monocytes Absolute: 0.7 10*3/uL (ref 0.1–1.0)
Monocytes Relative: 7 %
Neutro Abs: 6.6 10*3/uL (ref 1.7–7.7)
Neutrophils Relative %: 69 %
Platelets: 199 10*3/uL (ref 150–400)
RBC: 5.07 MIL/uL (ref 4.22–5.81)
RDW: 13.1 % (ref 11.5–15.5)
WBC: 9.6 10*3/uL (ref 4.0–10.5)
nRBC: 0 % (ref 0.0–0.2)

## 2021-11-17 LAB — BASIC METABOLIC PANEL
Anion gap: 9 (ref 5–15)
BUN: 24 mg/dL — ABNORMAL HIGH (ref 8–23)
CO2: 27 mmol/L (ref 22–32)
Calcium: 8.9 mg/dL (ref 8.9–10.3)
Chloride: 103 mmol/L (ref 98–111)
Creatinine, Ser: 1.06 mg/dL (ref 0.61–1.24)
GFR, Estimated: >60 mL/min (ref >60)
Glucose, Bld: 174 mg/dL — ABNORMAL HIGH (ref 70–99)
Potassium: 3.7 mmol/L (ref 3.5–5.1)
Sodium: 139 mmol/L (ref 135–145)

## 2021-11-17 MED ORDER — DOXYCYCLINE HYCLATE 100 MG PO CAPS: 100.0000 mg | ORAL_CAPSULE | Freq: Two times a day (BID) | ORAL | 0 refills | Status: DC

## 2021-11-17 MED ORDER — ALBUTEROL SULFATE (2.5 MG/3ML) 0.083% IN NEBU: 2.5000 mg | INHALATION_SOLUTION | Freq: Four times a day (QID) | RESPIRATORY_TRACT | 12 refills | Status: DC | PRN

## 2021-11-17 MED ORDER — ALBUTEROL SULFATE HFA 108 (90 BASE) MCG/ACT IN AERS: 2.0000 | INHALATION_SPRAY | RESPIRATORY_TRACT | Status: DC | PRN

## 2021-11-17 MED ORDER — DOXYCYCLINE HYCLATE 100 MG PO TABS
100.0000 mg | ORAL_TABLET | Freq: Once | ORAL | Status: AC
  Administered 2021-11-17: 100 mg via ORAL
  Filled 2021-11-17: qty 1

## 2021-11-17 MED ORDER — PREDNISONE 50 MG PO TABS
60.0000 mg | ORAL_TABLET | Freq: Once | ORAL | Status: AC
  Administered 2021-11-17: 60 mg via ORAL
  Filled 2021-11-17: qty 1

## 2021-11-17 MED ORDER — ONDANSETRON HCL 4 MG/2ML IJ SOLN
4.0000 mg | Freq: Once | INTRAMUSCULAR | Status: AC
  Administered 2021-11-17: 4 mg via INTRAVENOUS
  Filled 2021-11-17: qty 2

## 2021-11-17 MED ORDER — ALBUTEROL SULFATE (2.5 MG/3ML) 0.083% IN NEBU
INHALATION_SOLUTION | RESPIRATORY_TRACT | Status: AC
  Filled 2021-11-17: qty 12

## 2021-11-17 MED ORDER — ALBUTEROL SULFATE HFA 108 (90 BASE) MCG/ACT IN AERS: 2.0000 | INHALATION_SPRAY | RESPIRATORY_TRACT | 2 refills | Status: DC | PRN

## 2021-11-17 MED ORDER — ALBUTEROL SULFATE (2.5 MG/3ML) 0.083% IN NEBU
10.0000 mg/h | INHALATION_SOLUTION | Freq: Once | RESPIRATORY_TRACT | Status: AC
  Administered 2021-11-17: 10 mg/h via RESPIRATORY_TRACT

## 2021-11-17 MED ORDER — PREDNISONE 20 MG PO TABS: 40.0000 mg | ORAL_TABLET | Freq: Every day | ORAL | 0 refills | Status: DC

## 2021-11-17 NOTE — Progress Notes (Signed)
Pt given oral ABT, patient without SOB on room air O2 sat 96%, no c/o pain or discomfort at this time.

## 2021-11-17 NOTE — ED Notes (Signed)
[facility]iece contacted to pick patient up for discharge, denies anyone available for transportation at this time.

## 2022-01-04 IMAGING — CT CT HEAD W/O CM
3 series · 16 of 47 positions shown, 19 images · non-contrast
Comparison: None.

CLINICAL DATA: Headache

EXAM:
CT HEAD WITHOUT CONTRAST
TECHNIQUE: Contiguous axial images were obtained from the base of the skull
through the vertex without intravenous contrast.

[Series 2: head w o · axial · 0.43mm/px · z∈[+1495,+1620]mm · 10 of 30 slices shown, 13 images]
[im 3/30  brain]
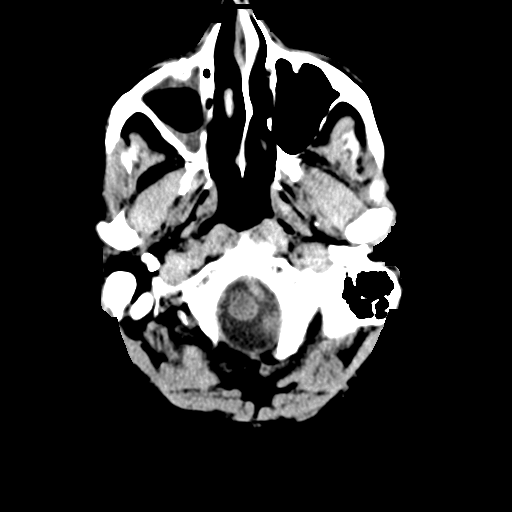
[im 3/30  bone]
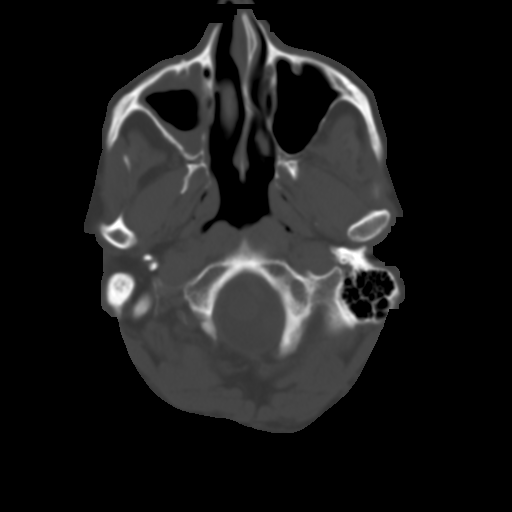
[im 6/30  brain]
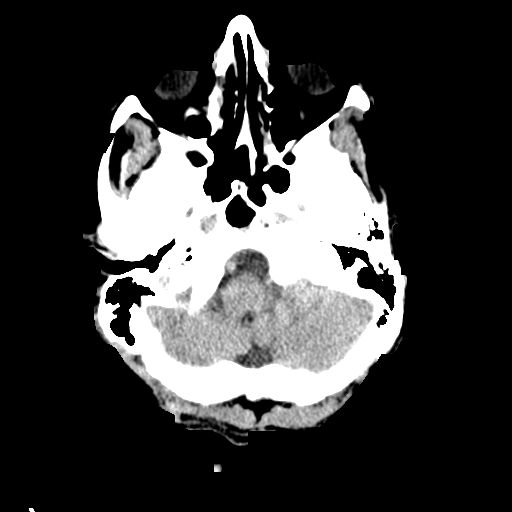
[im 9/30  brain]
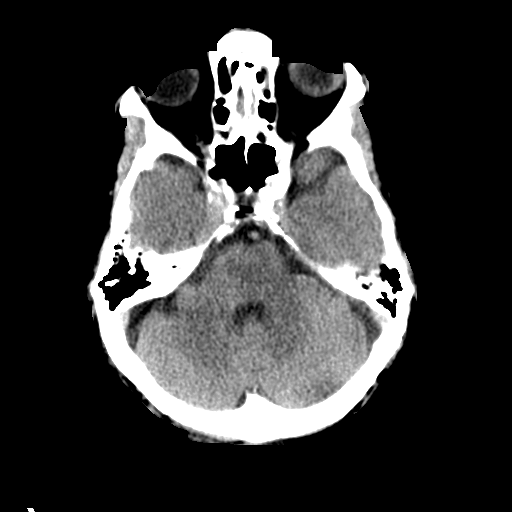
[im 11/30  brain]
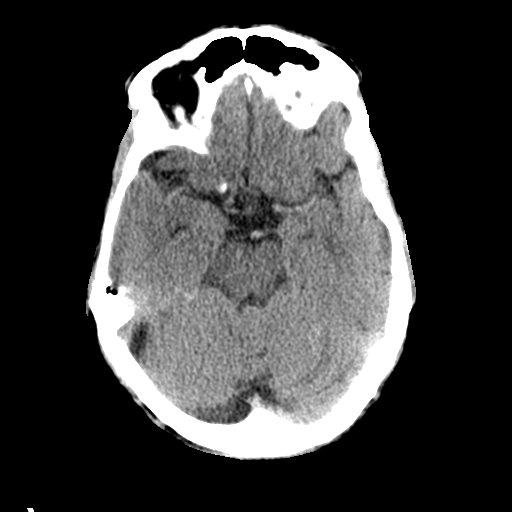
[im 14/30  brain]
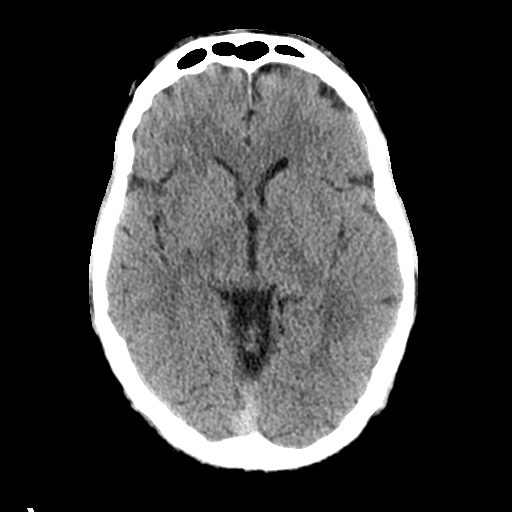
[im 14/30  bone]
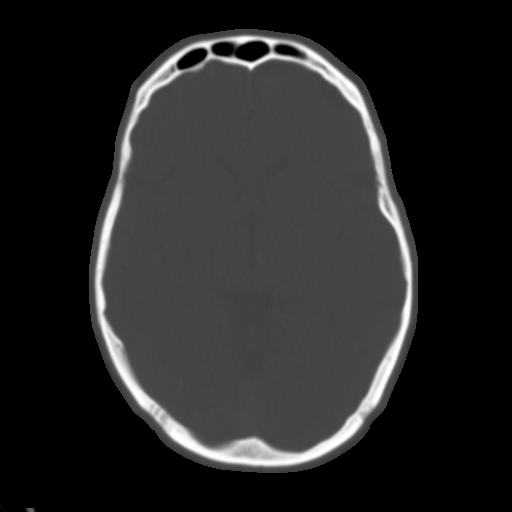
[im 17/30  brain]
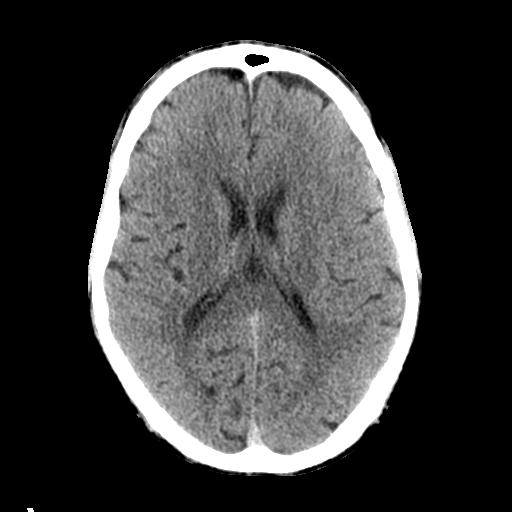
[im 20/30  brain]
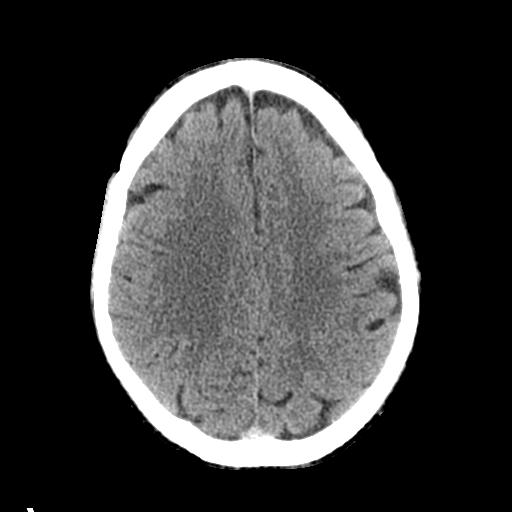
[im 23/30  brain]
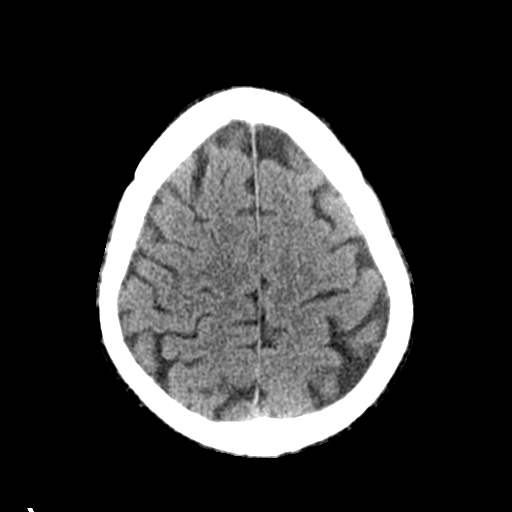
[im 25/30  brain]
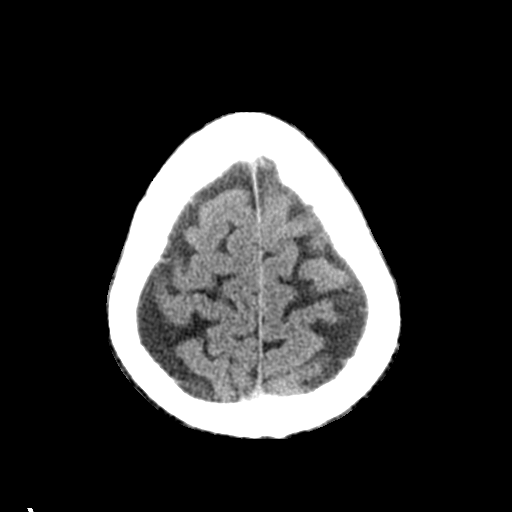
[im 25/30  bone]
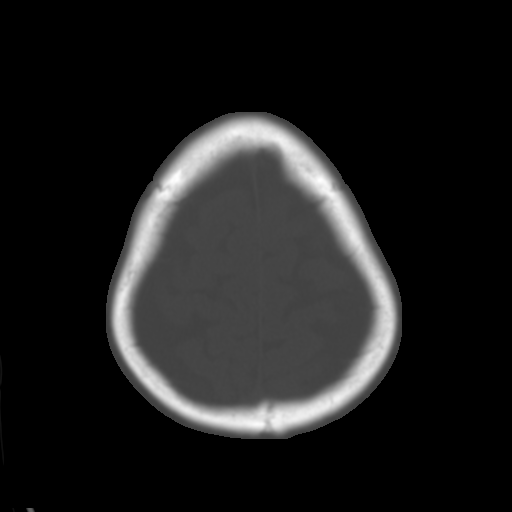
[im 28/30  brain]
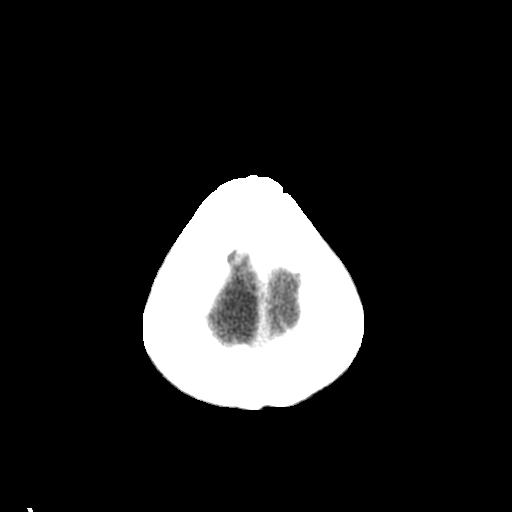

[Series 4: coronal soft · coronal · 0.34mm/px · 3 of 78 slices shown]
[im 26/78  brain]
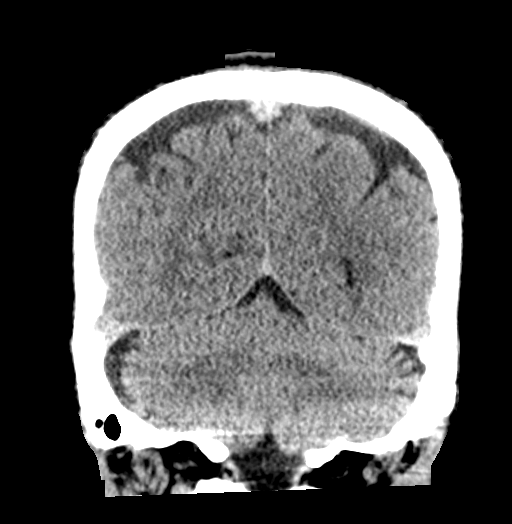
[im 35/78  brain]
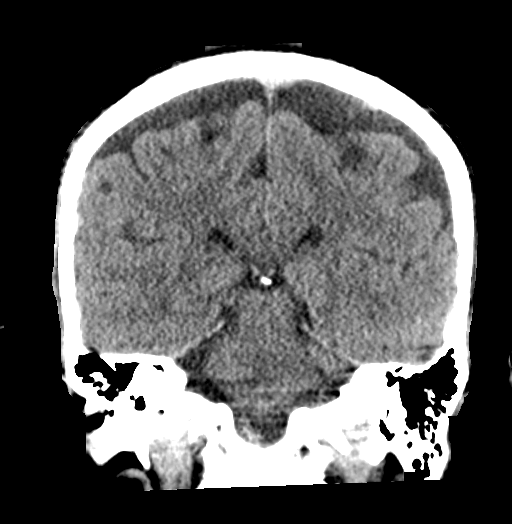
[im 43/78  brain]
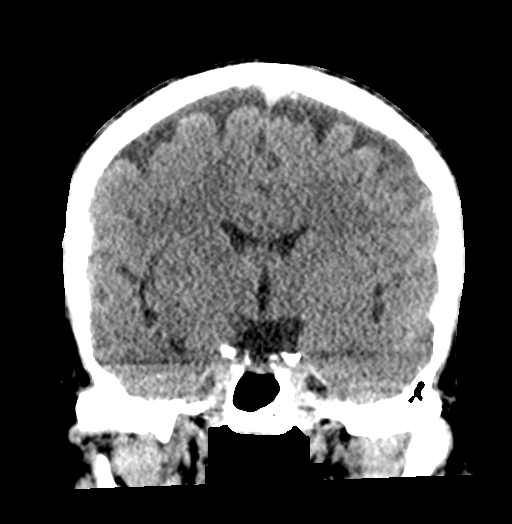

[Series 5: sagittal soft · sagittal · 0.32mm/px · 3 of 57 slices shown]
[im 19/57  brain]
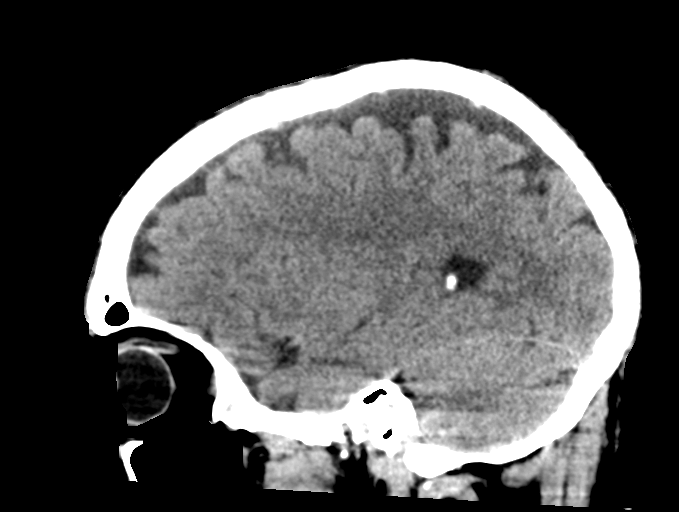
[im 29/57  brain]
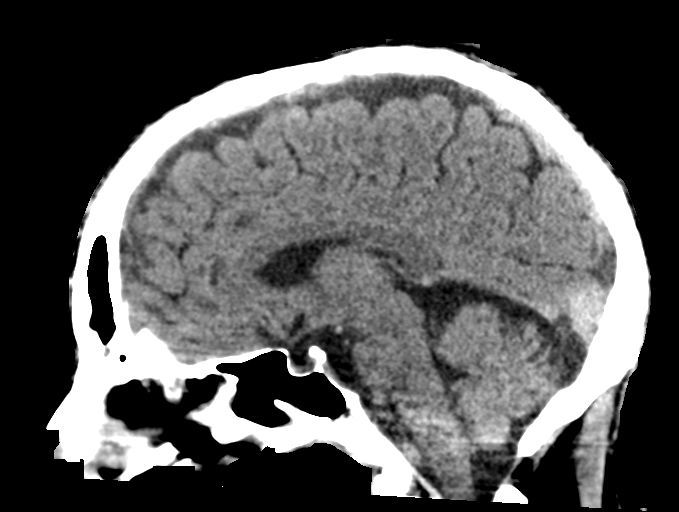
[im 38/57  brain]
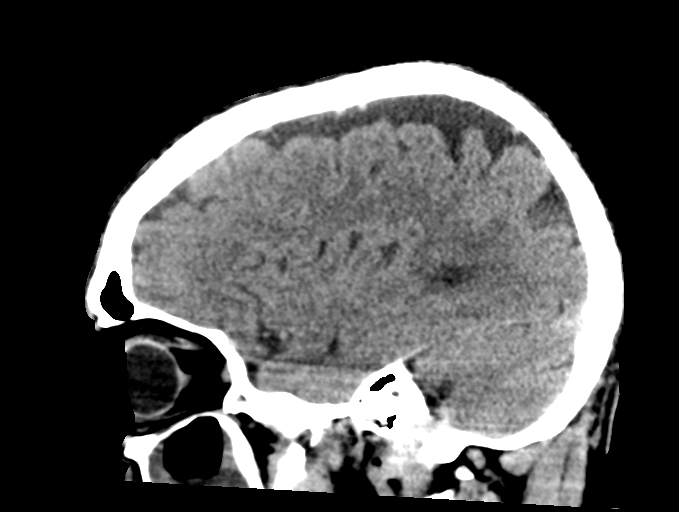

[16 of 47 positions shown; findings below may reference images not displayed]

FINDINGS: Brain: No acute territorial infarction, hemorrhage or intracranial
mass. Mild atrophy. Nonenlarged ventricles

Vascular: No hyperdense vessels.  No unexpected calcification

Skull: Normal. Negative for fracture or focal lesion.

Sinuses/Orbits: Moderate mucosal thickening in the right maxillary
sinus with moderate mucosal disease in the ethmoid sinuses and minor
disease in the frontal and sphenoid sinuses.

Other: None
IMPRESSION: 1. No CT evidence for acute intracranial abnormality.
2. Atrophy.
3. Paranasal sinus disease.

## 2022-09-24 ENCOUNTER — Emergency Department (HOSPITAL_COMMUNITY)
Admission: EM | Admit: 2022-09-24 | Discharge: 2022-09-25 | Disposition: A | Discharge status: Home / Self Care | Payer: Medicare Other | Attending: Emergency Medicine | Admitting: Emergency Medicine

## 2022-09-24 ENCOUNTER — Other Ambulatory Visit: Payer: Self-pay

## 2022-09-24 ENCOUNTER — Encounter (HOSPITAL_COMMUNITY): Payer: Self-pay | Admitting: Emergency Medicine

## 2022-09-24 DIAGNOSIS — J449 Chronic obstructive pulmonary disease, unspecified: Secondary | ICD-10-CM | POA: Insufficient documentation

## 2022-09-24 DIAGNOSIS — J4541 Moderate persistent asthma with (acute) exacerbation: Secondary | ICD-10-CM

## 2022-09-24 DIAGNOSIS — Z7982 Long term (current) use of aspirin: Secondary | ICD-10-CM | POA: Diagnosis not present

## 2022-09-24 DIAGNOSIS — R739 Hyperglycemia, unspecified: Secondary | ICD-10-CM | POA: Insufficient documentation

## 2022-09-24 DIAGNOSIS — Z7951 Long term (current) use of inhaled steroids: Secondary | ICD-10-CM | POA: Insufficient documentation

## 2022-09-24 DIAGNOSIS — Z7952 Long term (current) use of systemic steroids: Secondary | ICD-10-CM | POA: Insufficient documentation

## 2022-09-24 DIAGNOSIS — R0602 Shortness of breath: Secondary | ICD-10-CM | POA: Diagnosis present

## 2022-09-24 MED ORDER — METHYLPREDNISOLONE SODIUM SUCC 125 MG IJ SOLR
125.0000 mg | Freq: Once | INTRAMUSCULAR | Status: AC
  Administered 2022-09-24: 125 mg via INTRAVENOUS
  Filled 2022-09-24: qty 2

## 2022-09-24 MED ORDER — IPRATROPIUM BROMIDE 0.02 % IN SOLN
RESPIRATORY_TRACT | Status: AC
  Administered 2022-09-25: 0.5 mg
  Filled 2022-09-24: qty 2.5

## 2022-09-24 MED ORDER — ALBUTEROL SULFATE (2.5 MG/3ML) 0.083% IN NEBU
INHALATION_SOLUTION | RESPIRATORY_TRACT | Status: AC
  Filled 2022-09-24: qty 6

## 2022-09-24 NOTE — ED Triage Notes (Signed)
Pt with c/o sob , wheezes, and a possible insect bite.

## 2022-09-24 NOTE — ED Provider Notes (Signed)
[name] EMERGENCY DEPARTMENT AT [facility] Provider Note   CSN: [id number] Arrival date & time: 09/24/22  2336     History {Add pertinent medical, surgical, social history, OB history to HPI:1} Chief Complaint  Patient presents with   Shortness of Breath  Level 5 caveat due to acuity of condition  Brent Collins is a 72 y.o. male.  The history is provided by the patient.  Patient with history of COPD presents with shortness of breath.  Patient reports increasing cough and shortness of breath and wheezing.  Patient also mentions he may have a bug bite to his right lower leg.  No other details known on arrival    Past Medical History:  Diagnosis Date   Asthma    COPD (chronic obstructive pulmonary disease) (HCC)    Inguinal hernia     Home Medications Prior to Admission medications   Medication Sig Start Date End Date Taking? Authorizing Provider  albuterol (PROVENTIL) (2.5 MG/3ML) 0.083% nebulizer solution Take 3 mLs (2.5 mg total) by nebulization every 6 (six) hours as needed for wheezing or shortness of breath. 11/17/21   Gilda Crease, MD  albuterol (VENTOLIN HFA) 108 (90 Base) MCG/ACT inhaler Inhale 2 puffs into the lungs every 4 (four) hours as needed for wheezing or shortness of breath. 11/17/21   Gilda Crease, MD  Aspirin-Salicylamide-Caffeine (BC HEADACHE POWDER PO) Take 1 packet by mouth every 6 (six) hours as needed.    [provider]      Allergies    Tomato    Review of Systems   Review of Systems  Unable to perform ROS: Acuity of condition    Physical Exam Updated Vital Signs BP (!) 156/92 (BP Location: Left Arm)   Pulse 91   Temp 98 F (36.7 C) (Oral)   Resp (!) 21   Ht 1.6 m (5\' 3" )   Wt 73 kg   SpO2 96%   BMI 28.51 kg/m  Physical Exam CONSTITUTIONAL: Elderly, ill-appearing HEAD: Normocephalic/atraumatic EYES: EOMI/PERRL ENMT: Mucous membranes moist NECK: supple no meningeal signs CV: S1/S2 noted,  tachycardic LUNGS: Tachypneic and wheezing ABDOMEN: soft, nontender NEURO: Pt is awake/alert/appropriate, moves all extremitiesx4.  No facial droop.   EXTREMITIES: pulses normal/equal, full ROM No lower extremity edema Small abrasion noted to the right lower extremity.  No signs of abscess or cellulitis SKIN: warm, color normal PSYCH: Anxious  ED Results / Procedures / Treatments   Labs (all labs ordered are listed, but only abnormal results are displayed) Labs Reviewed  BASIC METABOLIC PANEL  CBC WITH DIFFERENTIAL/PLATELET    EKG None  Radiology No results found.  Procedures .Critical Care  Performed by: Zadie Rhine, MD Authorized by: Zadie Rhine, MD   Critical care provider statement:    Critical care start time:  09/24/2022 11:51 PM   Critical care end time:  09/24/2022 11:51 PM   Critical care time was exclusive of:  Separately billable procedures and treating other patients   Critical care was necessary to treat or prevent imminent or life-threatening deterioration of the following conditions:  Respiratory failure   Critical care was time spent personally by me on the following activities:  Examination of patient, re-evaluation of patient's condition, pulse oximetry, ordering and review of radiographic studies, ordering and review of laboratory studies, ordering and performing treatments and interventions, development of treatment plan with patient or surrogate and review of old charts   I assumed direction of critical care for this patient from another  provider in my specialty: no     {Document cardiac monitor, telemetry assessment procedure when appropriate:1}  Medications Ordered in ED Medications  methylPREDNISolone sodium succinate (SOLU-MEDROL) 125 mg/2 mL injection 125 mg (has no administration in time range)    ED Course/ Medical Decision Making/ A&P   {   Click here for ABCD2, HEART and other calculatorsREFRESH Note before signing :1}                           Medical Decision Making Amount and/or Complexity of Data Reviewed Labs: ordered. Radiology: ordered.  Risk Prescription drug management.   This patient presents to the ED for concern of shortness of breath, this involves an extensive number of treatment options, and is a complaint that carries with it a high risk of complications and morbidity.  The differential diagnosis includes but is not limited to Acute coronary syndrome, pneumonia, acute pulmonary edema, pneumothorax, acute anemia, pulmonary embolism    Comorbidities that complicate the patient evaluation: Patient's presentation is complicated by their history of COPD  Social Determinants of Health: Social history unknown  Additional history obtained: Records reviewed previous admission documents  Lab Tests: I Ordered, and personally interpreted labs.  The pertinent results include:  ***  Imaging Studies ordered: I ordered imaging studies including X-ray chest   I independently visualized and interpreted imaging which showed *** I agree with the radiologist interpretation  Cardiac Monitoring: The patient was maintained on a cardiac monitor.  I personally viewed and interpreted the cardiac monitor which showed an underlying rhythm of:  sinus rhythm  Medicines ordered and prescription drug management: I ordered medication including albuterol and Atrovent for wheezing Reevaluation of the patient after these medicines showed that the patient    {resolved/improved/worsened:23923::"improved"}  Test Considered: Patient is low risk / negative by ***, therefore do not feel that *** is indicated.  Critical Interventions:  ***  Consultations Obtained: I requested consultation with the {consultation:26851}, and discussed  findings as well as pertinent plan - they recommend: ***  Reevaluation: After the interventions noted above, I reevaluated the patient and found that they have  :{resolved/improved/worsened:23923::"improved"}  Complexity of problems addressed: Patient's presentation is most consistent with  {UXLK:44010}  Disposition: After consideration of the diagnostic results and the patient's response to treatment,  I feel that the patent would benefit from {disposition:26850}.     {Document critical care time when appropriate:1} {Document review of labs and clinical decision tools ie heart score, Chads2Vasc2 etc:1}  {Document your independent review of radiology images, and any outside records:1} {Document your discussion with family members, caretakers, and with consultants:1} {Document social determinants of health affecting pt's care:1} {Document your decision making why or why not admission, treatments were needed:1} Final Clinical Impression(s) / ED Diagnoses Final diagnoses:  None    Rx / DC Orders ED Discharge Orders     None

## 2022-09-25 ENCOUNTER — Emergency Department (HOSPITAL_COMMUNITY): Payer: Medicare Other

## 2022-09-25 LAB — BASIC METABOLIC PANEL
Anion gap: 8 (ref 5–15)
BUN: 20 mg/dL (ref 8–23)
CO2: 29 mmol/L (ref 22–32)
Calcium: 9.1 mg/dL (ref 8.9–10.3)
Chloride: 100 mmol/L (ref 98–111)
Creatinine, Ser: 1 mg/dL (ref 0.61–1.24)
GFR, Estimated: >60 mL/min (ref >60)
Glucose, Bld: 212 mg/dL — ABNORMAL HIGH (ref 70–99)
Potassium: 4.7 mmol/L (ref 3.5–5.1)
Sodium: 137 mmol/L (ref 135–145)

## 2022-09-25 LAB — CBC WITH DIFFERENTIAL/PLATELET
Abs Immature Granulocytes: 0.01 10*3/uL (ref 0.00–0.07)
Basophils Absolute: 0.1 10*3/uL (ref 0.0–0.1)
Basophils Relative: 1 %
Eosinophils Absolute: 0.5 10*3/uL (ref 0.0–0.5)
Eosinophils Relative: 7 %
HCT: 47.8 % (ref 39.0–52.0)
Hemoglobin: 16 g/dL (ref 13.0–17.0)
Immature Granulocytes: 0 %
Lymphocytes Relative: 24 %
Lymphs Abs: 1.9 10*3/uL (ref 0.7–4.0)
MCH: 30.7 pg (ref 26.0–34.0)
MCHC: 33.5 g/dL (ref 30.0–36.0)
MCV: 91.6 fL (ref 80.0–100.0)
Monocytes Absolute: 0.5 10*3/uL (ref 0.1–1.0)
Monocytes Relative: 7 %
Neutro Abs: 4.9 10*3/uL (ref 1.7–7.7)
Neutrophils Relative %: 61 %
Platelets: 196 10*3/uL (ref 150–400)
RBC: 5.22 MIL/uL (ref 4.22–5.81)
RDW: 13.8 % (ref 11.5–15.5)
WBC: 7.9 10*3/uL (ref 4.0–10.5)
nRBC: 0 % (ref 0.0–0.2)

## 2022-09-25 LAB — HEPATIC FUNCTION PANEL
ALT: 10 U/L (ref 0–44)
AST: 18 U/L (ref 15–41)
Albumin: 3.8 g/dL (ref 3.5–5.0)
Alkaline Phosphatase: 72 U/L (ref 38–126)
Bilirubin, Direct: 0.1 mg/dL (ref 0.0–0.2)
Indirect Bilirubin: 0.4 mg/dL (ref 0.3–0.9)
Total Bilirubin: 0.5 mg/dL (ref 0.3–1.2)
Total Protein: 7.5 g/dL (ref 6.5–8.1)

## 2022-09-25 LAB — LIPASE, BLOOD: Lipase: 39 U/L (ref 11–51)

## 2022-09-25 MED ORDER — ALBUTEROL SULFATE (2.5 MG/3ML) 0.083% IN NEBU
INHALATION_SOLUTION | RESPIRATORY_TRACT | Status: AC
  Administered 2022-09-25: 5 mg
  Filled 2022-09-25: qty 12

## 2022-09-25 MED ORDER — ALBUTEROL SULFATE (2.5 MG/3ML) 0.083% IN NEBU
10.0000 mg/h | INHALATION_SOLUTION | Freq: Once | RESPIRATORY_TRACT | Status: AC
  Administered 2022-09-25: 10 mg/h via RESPIRATORY_TRACT

## 2022-09-25 MED ORDER — PREDNISONE 50 MG PO TABS: ORAL_TABLET | ORAL | 0 refills | Status: DC

## 2022-09-25 MED ORDER — ALBUTEROL SULFATE HFA 108 (90 BASE) MCG/ACT IN AERS
2.0000 | INHALATION_SPRAY | Freq: Once | RESPIRATORY_TRACT | Status: AC
  Administered 2022-09-25: 2 via RESPIRATORY_TRACT
  Filled 2022-09-25: qty 6.7

## 2022-09-25 NOTE — Discharge Instructions (Addendum)
Take 1 puff from the inhaler every 4 hours for the next 2 days Please call the number listed to establish with a new primary doctor Take the prednisone-steroid prescription once a day for 4 days starting Tuesday

## 2022-09-25 NOTE — ED Notes (Signed)
Ambulated patient around nursing station on room air, O2 Sat decreased to 91% while ambulating no SOB or distess noted during ambulation.

## 2023-01-26 ENCOUNTER — Emergency Department (HOSPITAL_COMMUNITY): Payer: Medicare Other

## 2023-01-26 ENCOUNTER — Emergency Department (HOSPITAL_COMMUNITY)
Admission: EM | Admit: 2023-01-26 | Discharge: 2023-01-26 | Disposition: A | Discharge status: Home / Self Care | Payer: Medicare Other | Attending: Emergency Medicine | Admitting: Emergency Medicine

## 2023-01-26 ENCOUNTER — Other Ambulatory Visit: Payer: Self-pay

## 2023-01-26 ENCOUNTER — Encounter (HOSPITAL_COMMUNITY): Payer: Self-pay

## 2023-01-26 DIAGNOSIS — Z20822 Contact with and (suspected) exposure to covid-19: Secondary | ICD-10-CM | POA: Insufficient documentation

## 2023-01-26 DIAGNOSIS — J4541 Moderate persistent asthma with (acute) exacerbation: Secondary | ICD-10-CM | POA: Insufficient documentation

## 2023-01-26 DIAGNOSIS — J45901 Unspecified asthma with (acute) exacerbation: Secondary | ICD-10-CM

## 2023-01-26 DIAGNOSIS — R0602 Shortness of breath: Secondary | ICD-10-CM | POA: Diagnosis present

## 2023-01-26 LAB — CBC WITH DIFFERENTIAL/PLATELET
Abs Immature Granulocytes: 0.02 10*3/uL (ref 0.00–0.07)
Basophils Absolute: 0 10*3/uL (ref 0.0–0.1)
Basophils Relative: 1 %
Eosinophils Absolute: 0.2 10*3/uL (ref 0.0–0.5)
Eosinophils Relative: 2 %
HCT: 48.3 % (ref 39.0–52.0)
Hemoglobin: 16.4 g/dL (ref 13.0–17.0)
Immature Granulocytes: 0 %
Lymphocytes Relative: 13 %
Lymphs Abs: 1 10*3/uL (ref 0.7–4.0)
MCH: 31.1 pg (ref 26.0–34.0)
MCHC: 34 g/dL (ref 30.0–36.0)
MCV: 91.7 fL (ref 80.0–100.0)
Monocytes Absolute: 0.5 10*3/uL (ref 0.1–1.0)
Monocytes Relative: 7 %
Neutro Abs: 6.3 10*3/uL (ref 1.7–7.7)
Neutrophils Relative %: 77 %
Platelets: 196 10*3/uL (ref 150–400)
RBC: 5.27 MIL/uL (ref 4.22–5.81)
RDW: 13.6 % (ref 11.5–15.5)
WBC: 8.1 10*3/uL (ref 4.0–10.5)
nRBC: 0 % (ref 0.0–0.2)

## 2023-01-26 LAB — COMPREHENSIVE METABOLIC PANEL
ALT: 11 U/L (ref 0–44)
AST: 22 U/L (ref 15–41)
Albumin: 4.2 g/dL (ref 3.5–5.0)
Alkaline Phosphatase: 64 U/L (ref 38–126)
Anion gap: 10 (ref 5–15)
BUN: 23 mg/dL (ref 8–23)
CO2: 27 mmol/L (ref 22–32)
Calcium: 9.1 mg/dL (ref 8.9–10.3)
Chloride: 102 mmol/L (ref 98–111)
Creatinine, Ser: 0.92 mg/dL (ref 0.61–1.24)
GFR, Estimated: >60 mL/min (ref >60)
Glucose, Bld: 143 mg/dL — ABNORMAL HIGH (ref 70–99)
Potassium: 4.5 mmol/L (ref 3.5–5.1)
Sodium: 139 mmol/L (ref 135–145)
Total Bilirubin: 1 mg/dL (ref 0.3–1.2)
Total Protein: 7.4 g/dL (ref 6.5–8.1)

## 2023-01-26 LAB — RESP PANEL BY RT-PCR (RSV, FLU A&B, COVID)  RVPGX2
Influenza A by PCR: NEGATIVE
Influenza B by PCR: NEGATIVE
Resp Syncytial Virus by PCR: NEGATIVE
SARS Coronavirus 2 by RT PCR: NEGATIVE

## 2023-01-26 MED ORDER — IPRATROPIUM BROMIDE 0.02 % IN SOLN
1.0000 mg | Freq: Once | RESPIRATORY_TRACT | Status: DC
  Filled 2023-01-26: qty 5

## 2023-01-26 MED ORDER — AZITHROMYCIN 250 MG PO TABS: 250.0000 mg | ORAL_TABLET | Freq: Every day | ORAL | 0 refills | Status: DC

## 2023-01-26 MED ORDER — ALBUTEROL SULFATE HFA 108 (90 BASE) MCG/ACT IN AERS: 2.0000 | INHALATION_SPRAY | RESPIRATORY_TRACT | 2 refills | Status: AC | PRN

## 2023-01-26 MED ORDER — IPRATROPIUM BROMIDE 0.02 % IN SOLN
0.5000 mg | Freq: Once | RESPIRATORY_TRACT | Status: AC
  Administered 2023-01-26: 0.5 mg via RESPIRATORY_TRACT

## 2023-01-26 MED ORDER — ALBUTEROL SULFATE (2.5 MG/3ML) 0.083% IN NEBU
7.5000 mg | INHALATION_SOLUTION | Freq: Once | RESPIRATORY_TRACT | Status: DC
  Filled 2023-01-26: qty 9

## 2023-01-26 MED ORDER — PREDNISONE 50 MG PO TABS: ORAL_TABLET | ORAL | 0 refills | Status: DC

## 2023-01-26 MED ORDER — ALBUTEROL SULFATE HFA 108 (90 BASE) MCG/ACT IN AERS: 2.0000 | INHALATION_SPRAY | RESPIRATORY_TRACT | Status: DC | PRN

## 2023-01-26 MED ORDER — ALBUTEROL SULFATE (2.5 MG/3ML) 0.083% IN NEBU: 5.0000 mg | INHALATION_SOLUTION | Freq: Once | RESPIRATORY_TRACT | Status: DC

## 2023-01-26 MED ORDER — METHYLPREDNISOLONE SODIUM SUCC 125 MG IJ SOLR
125.0000 mg | Freq: Once | INTRAMUSCULAR | Status: AC
  Administered 2023-01-26: 125 mg via INTRAVENOUS
  Filled 2023-01-26: qty 2

## 2023-01-26 MED ORDER — IPRATROPIUM-ALBUTEROL 0.5-2.5 (3) MG/3ML IN SOLN
3.0000 mL | Freq: Once | RESPIRATORY_TRACT | Status: AC
  Administered 2023-01-26: 3 mL via RESPIRATORY_TRACT
  Filled 2023-01-26: qty 3

## 2023-01-26 MED ORDER — ALBUTEROL SULFATE (2.5 MG/3ML) 0.083% IN NEBU
5.0000 mg | INHALATION_SOLUTION | Freq: Once | RESPIRATORY_TRACT | Status: AC
  Administered 2023-01-26: 5 mg via RESPIRATORY_TRACT

## 2023-01-26 MED ORDER — ALBUTEROL SULFATE (2.5 MG/3ML) 0.083% IN NEBU: 2.5000 mg | INHALATION_SOLUTION | Freq: Four times a day (QID) | RESPIRATORY_TRACT | 12 refills | Status: AC | PRN

## 2023-01-26 MED ORDER — AZITHROMYCIN 250 MG PO TABS
500.0000 mg | ORAL_TABLET | ORAL | Status: AC
  Administered 2023-01-26: 500 mg via ORAL
  Filled 2023-01-26: qty 2

## 2023-01-26 NOTE — ED Notes (Signed)
Duo neb started in VT1

## 2023-01-26 NOTE — ED Notes (Signed)
Pt reports significant improvement in respiratory efforts. Resp assessment performed, as noted. Pt has no complaints of pain or sick feeling.

## 2023-01-26 NOTE — Discharge Instructions (Signed)
You were seen for your COPD exacerbation in the emergency department.   At home, please use your inhaler for any wheezing.  Take the prednisone and azithromycin for your exacerbation.    Check your MyChart online for the results of any tests that had not resulted by the time you left the emergency department.   Follow-up with your primary doctor in 2-3 days regarding your visit.    Return immediately to the emergency department if you experience any of the following: Difficulty breathing, or any other concerning symptoms.    Thank you for visiting our Emergency Department. It was a pleasure taking care of you today.

## 2023-01-26 NOTE — ED Provider Notes (Signed)
[place] EMERGENCY DEPARTMENT AT [facility] Provider Note   CSN: [id number] Arrival date & time: 01/26/23  1509     History {Add pertinent medical, surgical, social history, OB history to HPI:1} Chief Complaint  Patient presents with   Shortness of Breath    Brent LUSCHER is a 72 y.o. male.  72 year old male with history of COPD who presents emergency department with cough and shortness of breath.  This morning started experiencing shortness of breath.  Tried his inhaler but did not improve significantly and so he decided to come into the emergency department.  Also has had a cough productive of green sputum recently.  No fevers.  No chest pain.  No leg swelling.  No history of intubations for his COPD.  Says he does not smoke.       Home Medications Prior to Admission medications   Medication Sig Start Date End Date Taking? Authorizing Provider  albuterol (PROVENTIL) (2.5 MG/3ML) 0.083% nebulizer solution Take 3 mLs (2.5 mg total) by nebulization every 6 (six) hours as needed for wheezing or shortness of breath. 11/17/21   Gilda Crease, MD  albuterol (VENTOLIN HFA) 108 (90 Base) MCG/ACT inhaler Inhale 2 puffs into the lungs every 4 (four) hours as needed for wheezing or shortness of breath. 11/17/21   Gilda Crease, MD  Aspirin-Salicylamide-Caffeine (BC HEADACHE POWDER PO) Take 1 packet by mouth every 6 (six) hours as needed.    [provider]  predniSONE (DELTASONE) 50 MG tablet 1 tablet PO QD X4 days 09/25/22   Zadie Rhine, MD      Allergies    Tomato    Review of Systems   Review of Systems  Physical Exam Updated Vital Signs BP (!) 141/85   Pulse 94   Temp 97.9 F (36.6 C) (Oral)   Resp (!) 24   Ht 5\' 3"  (1.6 m)   Wt 59 kg   SpO2 93%   BMI 23.03 kg/m  Physical Exam Vitals and nursing note reviewed.  Constitutional:      General: He is not in acute distress.    Appearance: He is well-developed.  HENT:     Head:  Normocephalic and atraumatic.     Right Ear: External ear normal.     Left Ear: External ear normal.     Nose: Nose normal.  Eyes:     Extraocular Movements: Extraocular movements intact.     Conjunctiva/sclera: Conjunctivae normal.     Pupils: Pupils are equal, round, and reactive to light.  Cardiovascular:     Rate and Rhythm: Normal rate and regular rhythm.     Heart sounds: Normal heart sounds.  Pulmonary:     Effort: Pulmonary effort is normal. No respiratory distress.     Breath sounds: Wheezing (Diffuse expiratory) present.     Comments: On facemask getting DuoNeb Musculoskeletal:     Cervical back: Normal range of motion and neck supple.     Right lower leg: No edema.     Left lower leg: No edema.  Skin:    General: Skin is warm and dry.  Neurological:     Mental Status: He is alert. Mental status is at baseline.  Psychiatric:        Mood and Affect: Mood normal.        Behavior: Behavior normal.     ED Results / Procedures / Treatments   Labs (all labs ordered are listed, but only abnormal results are displayed) Labs Reviewed  RESP PANEL BY RT-PCR (RSV, FLU A&B, COVID)  RVPGX2  CBC WITH DIFFERENTIAL/PLATELET  COMPREHENSIVE METABOLIC PANEL    EKG None  Radiology No results found.  Procedures Procedures  {Document cardiac monitor, telemetry assessment procedure when appropriate:1}  Medications Ordered in ED Medications  ipratropium (ATROVENT) nebulizer solution 1 mg (has no administration in time range)  methylPREDNISolone sodium succinate (SOLU-MEDROL) 125 mg/2 mL injection 125 mg (has no administration in time range)  albuterol (PROVENTIL) (2.5 MG/3ML) 0.083% nebulizer solution 7.5 mg (has no administration in time range)  ipratropium-albuterol (DUONEB) 0.5-2.5 (3) MG/3ML nebulizer solution 3 mL (3 mLs Nebulization Given 01/26/23 1528)    ED Course/ Medical Decision Making/ A&P   {   Click here for ABCD2, HEART and other calculatorsREFRESH Note  before signing :1}                              Medical Decision Making Amount and/or Complexity of Data Reviewed Labs: ordered. Radiology: ordered. ECG/medicine tests: ordered.  Risk Prescription drug management.   ***  {Document critical care time when appropriate:1} {Document review of labs and clinical decision tools ie heart score, Chads2Vasc2 etc:1}  {Document your independent review of radiology images, and any outside records:1} {Document your discussion with family members, caretakers, and with consultants:1} {Document social determinants of health affecting pt's care:1} {Document your decision making why or why not admission, treatments were needed:1} Final Clinical Impression(s) / ED Diagnoses Final diagnoses:  None    Rx / DC Orders ED Discharge Orders     None

## 2023-01-26 NOTE — ED Triage Notes (Signed)
Audible wheezing in triage  Pt labored   Pt complains of SOB started this morning.  Hx of asthma Inhaler not helping.

## 2023-07-22 ENCOUNTER — Emergency Department (HOSPITAL_COMMUNITY)

## 2023-07-22 ENCOUNTER — Encounter (HOSPITAL_COMMUNITY): Payer: Self-pay | Admitting: Emergency Medicine

## 2023-07-22 ENCOUNTER — Inpatient Hospital Stay (HOSPITAL_COMMUNITY)
Admission: EM | Admit: 2023-07-22 | Discharge: 2023-07-25 | DRG: 189 | Disposition: A | Discharge status: Home / Self Care | Attending: Internal Medicine | Admitting: Internal Medicine

## 2023-07-22 ENCOUNTER — Other Ambulatory Visit: Payer: Self-pay

## 2023-07-22 DIAGNOSIS — J9602 Acute respiratory failure with hypercapnia: Secondary | ICD-10-CM | POA: Diagnosis not present

## 2023-07-22 DIAGNOSIS — Z87891 Personal history of nicotine dependence: Secondary | ICD-10-CM

## 2023-07-22 DIAGNOSIS — R112 Nausea with vomiting, unspecified: Secondary | ICD-10-CM

## 2023-07-22 DIAGNOSIS — I10 Essential (primary) hypertension: Secondary | ICD-10-CM | POA: Diagnosis present

## 2023-07-22 DIAGNOSIS — Z91018 Allergy to other foods: Secondary | ICD-10-CM

## 2023-07-22 DIAGNOSIS — K4090 Unilateral inguinal hernia, without obstruction or gangrene, not specified as recurrent: Secondary | ICD-10-CM | POA: Diagnosis present

## 2023-07-22 DIAGNOSIS — E1165 Type 2 diabetes mellitus with hyperglycemia: Secondary | ICD-10-CM | POA: Diagnosis present

## 2023-07-22 DIAGNOSIS — Z1152 Encounter for screening for COVID-19: Secondary | ICD-10-CM

## 2023-07-22 DIAGNOSIS — J9601 Acute respiratory failure with hypoxia: Secondary | ICD-10-CM | POA: Diagnosis not present

## 2023-07-22 DIAGNOSIS — K56609 Unspecified intestinal obstruction, unspecified as to partial versus complete obstruction: Secondary | ICD-10-CM | POA: Diagnosis present

## 2023-07-22 DIAGNOSIS — K56600 Partial intestinal obstruction, unspecified as to cause: Secondary | ICD-10-CM | POA: Diagnosis present

## 2023-07-22 DIAGNOSIS — J441 Chronic obstructive pulmonary disease with (acute) exacerbation: Principal | ICD-10-CM | POA: Diagnosis present

## 2023-07-22 DIAGNOSIS — Z79899 Other long term (current) drug therapy: Secondary | ICD-10-CM

## 2023-07-22 DIAGNOSIS — R1900 Intra-abdominal and pelvic swelling, mass and lump, unspecified site: Secondary | ICD-10-CM

## 2023-07-22 DIAGNOSIS — Z7982 Long term (current) use of aspirin: Secondary | ICD-10-CM

## 2023-07-22 LAB — RESP PANEL BY RT-PCR (RSV, FLU A&B, COVID)  RVPGX2
Influenza A by PCR: NEGATIVE
Influenza B by PCR: NEGATIVE
Resp Syncytial Virus by PCR: NEGATIVE
SARS Coronavirus 2 by RT PCR: NEGATIVE

## 2023-07-22 LAB — CBC
HCT: 49.1 % (ref 39.0–52.0)
Hemoglobin: 16.6 g/dL (ref 13.0–17.0)
MCH: 31.3 pg (ref 26.0–34.0)
MCHC: 33.8 g/dL (ref 30.0–36.0)
MCV: 92.6 fL (ref 80.0–100.0)
Platelets: 195 10*3/uL (ref 150–400)
RBC: 5.3 MIL/uL (ref 4.22–5.81)
RDW: 13.2 % (ref 11.5–15.5)
WBC: 8.8 10*3/uL (ref 4.0–10.5)
nRBC: 0 % (ref 0.0–0.2)

## 2023-07-22 LAB — RESPIRATORY PANEL BY PCR

## 2023-07-22 LAB — BLOOD GAS, VENOUS
Acid-Base Excess: 8.7 mmol/L — ABNORMAL HIGH (ref 0.0–2.0)
Bicarbonate: 36.4 mmol/L — ABNORMAL HIGH (ref 20.0–28.0)
Drawn by: 67355
O2 Saturation: 62.1 %
Patient temperature: 36.3
pCO2, Ven: 61 mmHg — ABNORMAL HIGH (ref 44–60)
pH, Ven: 7.38 (ref 7.25–7.43)
pO2, Ven: 35 mmHg (ref 32–45)

## 2023-07-22 LAB — TROPONIN I (HIGH SENSITIVITY)
Troponin I (High Sensitivity): 8 ng/L (ref <18)
Troponin I (High Sensitivity): 7 ng/L

## 2023-07-22 LAB — BASIC METABOLIC PANEL WITH GFR
Anion gap: 9 (ref 5–15)
BUN: 20 mg/dL (ref 8–23)
CO2: 30 mmol/L (ref 22–32)
Calcium: 9.4 mg/dL (ref 8.9–10.3)
Chloride: 103 mmol/L (ref 98–111)
Creatinine, Ser: 0.95 mg/dL (ref 0.61–1.24)
GFR, Estimated: >60 mL/min (ref >60)
Glucose, Bld: 150 mg/dL — ABNORMAL HIGH (ref 70–99)
Potassium: 4.6 mmol/L (ref 3.5–5.1)
Sodium: 142 mmol/L (ref 135–145)

## 2023-07-22 LAB — BRAIN NATRIURETIC PEPTIDE: B Natriuretic Peptide: 33 pg/mL (ref 0.0–100.0)

## 2023-07-22 MED ORDER — ENOXAPARIN SODIUM 40 MG/0.4ML IJ SOSY
40.0000 mg | PREFILLED_SYRINGE | INTRAMUSCULAR | Status: DC
  Administered 2023-07-22 – 2023-07-24 (×3): 40 mg via SUBCUTANEOUS
  Filled 2023-07-22 (×3): qty 0.4

## 2023-07-22 MED ORDER — IPRATROPIUM-ALBUTEROL 0.5-2.5 (3) MG/3ML IN SOLN
3.0000 mL | Freq: Once | RESPIRATORY_TRACT | Status: AC
  Administered 2023-07-22: 3 mL via RESPIRATORY_TRACT
  Filled 2023-07-22: qty 3

## 2023-07-22 MED ORDER — METHYLPREDNISOLONE SODIUM SUCC 125 MG IJ SOLR
125.0000 mg | Freq: Once | INTRAMUSCULAR | Status: AC
  Administered 2023-07-22: 125 mg via INTRAVENOUS
  Filled 2023-07-22: qty 2

## 2023-07-22 MED ORDER — ACETAMINOPHEN 650 MG RE SUPP: 650.0000 mg | Freq: Four times a day (QID) | RECTAL | Status: DC | PRN

## 2023-07-22 MED ORDER — MAGNESIUM SULFATE 2 GM/50ML IV SOLN
2.0000 g | Freq: Once | INTRAVENOUS | Status: AC
  Administered 2023-07-22: 2 g via INTRAVENOUS
  Filled 2023-07-22: qty 50

## 2023-07-22 MED ORDER — ONDANSETRON HCL 4 MG PO TABS: 4.0000 mg | ORAL_TABLET | Freq: Four times a day (QID) | ORAL | Status: DC | PRN

## 2023-07-22 MED ORDER — ACETAMINOPHEN 325 MG PO TABS: 650.0000 mg | ORAL_TABLET | Freq: Four times a day (QID) | ORAL | Status: DC | PRN

## 2023-07-22 MED ORDER — METHYLPREDNISOLONE SODIUM SUCC 40 MG IJ SOLR
40.0000 mg | INTRAMUSCULAR | Status: DC
  Administered 2023-07-22 – 2023-07-25 (×4): 40 mg via INTRAVENOUS
  Filled 2023-07-22 (×4): qty 1

## 2023-07-22 MED ORDER — ARFORMOTEROL TARTRATE 15 MCG/2ML IN NEBU
15.0000 ug | INHALATION_SOLUTION | Freq: Two times a day (BID) | RESPIRATORY_TRACT | Status: DC
  Administered 2023-07-22 – 2023-07-25 (×7): 15 ug via RESPIRATORY_TRACT
  Filled 2023-07-22 (×8): qty 2

## 2023-07-22 MED ORDER — ONDANSETRON HCL 4 MG/2ML IJ SOLN
4.0000 mg | Freq: Four times a day (QID) | INTRAMUSCULAR | Status: DC | PRN
  Administered 2023-07-23 (×2): 4 mg via INTRAVENOUS
  Filled 2023-07-22 (×2): qty 2

## 2023-07-22 MED ORDER — IPRATROPIUM-ALBUTEROL 0.5-2.5 (3) MG/3ML IN SOLN
3.0000 mL | Freq: Four times a day (QID) | RESPIRATORY_TRACT | Status: DC
  Administered 2023-07-22 (×3): 3 mL via RESPIRATORY_TRACT
  Filled 2023-07-22 (×3): qty 3

## 2023-07-22 MED ORDER — ALBUTEROL SULFATE HFA 108 (90 BASE) MCG/ACT IN AERS
2.0000 | INHALATION_SPRAY | RESPIRATORY_TRACT | Status: DC | PRN
  Administered 2023-07-22: 2 via RESPIRATORY_TRACT
  Filled 2023-07-22: qty 6.7

## 2023-07-22 MED ORDER — BUDESONIDE 0.5 MG/2ML IN SUSP
0.5000 mg | Freq: Two times a day (BID) | RESPIRATORY_TRACT | Status: DC
  Administered 2023-07-22 – 2023-07-25 (×7): 0.5 mg via RESPIRATORY_TRACT
  Filled 2023-07-22 (×8): qty 2

## 2023-07-22 NOTE — Progress Notes (Signed)
 1610 Patient eating, unavailable for nebulizer treatment at this time.

## 2023-07-22 NOTE — Progress Notes (Signed)
   07/22/23 1452  TOC Brief Assessment  Insurance and Status Reviewed  Home environment has been reviewed Single family home  Prior level of function: Independent  Readmission risk has been reviewed Yes  Transition of care needs no transition of care needs at this time   Transition of Care Department [facility]) has reviewed patient and no other TOC needs have been identified at this time. We will continue to monitor patient advancement through interdisciplinary progression rounds. If new patient needs arise, please place a TOC consult.

## 2023-07-22 NOTE — Hospital Course (Addendum)
 73 year old male with history of COPD and hypertension presenting with 2-day history of shortness of breath and worsening cough.  He has subjective fevers and chills at home.  He states that his cough has been largely nonproductive.  He lives with his sister and boyfriend both of whom smoke.  The patient himself states that he quit smoking 20 years ago.  He denies any chest pain, hemoptysis, nausea, vomiting or diarrhea, abdominal pain.  He denies any worsening leg edema.  The patient states that he has been using his his inhaler at home without much improvement.  As result he presented for further evaluation and treatment. In the ED, the patient was afebrile and hemodynamically stable with oxygen  saturation 77% on room air.  He was placed on 2 L with saturation up to 96%.  WBC 8.8, hemoglobin 16.6, platelets 195.  Sodium 142, potassium 4.6, bicarbonate 30, serum creatinine 0.95.  Troponin 8>> 7.  BNP 33.  Chest x-ray showed chronic interstitial markings.  COVID-19 PCR is negative.  EKG shows sinus rhythm with nonspecific T wave changes.  VBG showed 7.3 8/61/35/36.  The patient was started on bronchodilators and Solu-Medrol .  He was admitted for further evaluation and treatment of his respiratory failure.  Unfortunately, his discharge is delayed due to development of recurrent N/V during the hospitalization.   CT abd/pelvis ordered.  The CT showed small bowel obstruction with abrupt transition point in the right central abdomen.  There is also a heterogenous mass with internal calcifications measuring 6.0 x 6.4 cm in the pelvis that was concerning for possible neoplasm.  Nonemergent pelvic MRI was recommended.  General surgery was consulted to assist with management.

## 2023-07-22 NOTE — Plan of Care (Signed)

## 2023-07-22 NOTE — ED Provider Notes (Signed)
 [place] EMERGENCY DEPARTMENT AT [facility] Provider Note   CSN: [id number] Arrival date & time: 07/22/23  0346     History  Chief Complaint  Patient presents with   Shortness of Breath    Brent Collins is a 73 y.o. male.  Patient is a 73 year old male with history of asthma/COPD.  Patient presenting today with complaints of shortness of breath and wheezing.  Symptoms worsening over the past few days.  He describes some cough that is nonproductive.  No fevers or chills.  No chest pain.  No leg pain or swelling.  He has been using his inhaler with little relief.  Patient is not on home oxygen .       Home Medications Prior to Admission medications   Medication Sig Start Date End Date Taking? Authorizing Provider  albuterol  (PROVENTIL ) (2.5 MG/3ML) 0.083% nebulizer solution Take 3 mLs (2.5 mg total) by nebulization every 6 (six) hours as needed for wheezing or shortness of breath. 01/26/23   Ninetta Basket, MD  albuterol  (VENTOLIN  HFA) 108 [phone] Base) MCG/ACT inhaler Inhale 2 puffs into the lungs every 4 (four) hours as needed for wheezing or shortness of breath. 01/26/23   Ninetta Basket, MD  Aspirin-Salicylamide-Caffeine [facility] HEADACHE POWDER PO) Take 1 packet by mouth every 6 (six) hours as needed.    [provider]  azithromycin  (ZITHROMAX ) 250 MG tablet Take 1 tablet (250 mg total) by mouth daily. Take first 2 tablets together, then 1 every day until finished. 01/26/23   Ninetta Basket, MD  predniSONE  (DELTASONE ) 50 MG tablet 1 tablet PO QD X4 days 01/26/23   Ninetta Basket, MD      Allergies    Tomato    Review of Systems   Review of Systems  All other systems reviewed and are negative.   Physical Exam Updated Vital Signs BP (!) 155/93 (BP Location: Right Arm)   Pulse 87   Temp 98.4 F (36.9 C) (Oral)   Resp (!) 24   Ht 5\' 3"  (1.6 m)   Wt 59 kg   SpO2 99%   BMI 23.04 kg/m  Physical Exam Vitals and nursing note reviewed.   Constitutional:      General: He is not in acute distress.    Appearance: He is well-developed. He is not diaphoretic.  HENT:     Head: Normocephalic and atraumatic.  Cardiovascular:     Rate and Rhythm: Normal rate and regular rhythm.     Heart sounds: No murmur heard.    No friction rub.  Pulmonary:     Effort: Pulmonary effort is normal. No respiratory distress.     Breath sounds: Examination of the right-middle field reveals rhonchi. Examination of the left-middle field reveals rhonchi. Rhonchi present. No wheezing or rales.  Abdominal:     General: Bowel sounds are normal. There is no distension.     Palpations: Abdomen is soft.     Tenderness: There is no abdominal tenderness.  Musculoskeletal:        General: Normal range of motion.     Cervical back: Normal range of motion and neck supple.  Skin:    General: Skin is warm and dry.  Neurological:     Mental Status: He is alert and oriented to person, place, and time.     Coordination: Coordination normal.     ED Results / Procedures / Treatments   Labs (all labs ordered are listed, but only abnormal results are displayed)  Labs Reviewed  BASIC METABOLIC PANEL WITH GFR  CBC  BLOOD GAS, VENOUS    EKG EKG Interpretation Date/Time:  Sunday July 22 2023 04:02:51 EDT Ventricular Rate:  85 PR Interval:  195 QRS Duration:  95 QT Interval:  355 QTC Calculation: 423 R Axis:   12  Text Interpretation: Sinus rhythm Right atrial enlargement Anterior infarct, old No significant change since 01/26/2023 Confirmed by Orvilla Blander ([id number]) on 07/22/2023 4:20:43 AM  Radiology No results found.  Procedures Procedures    Medications Ordered in ED Medications  albuterol  (VENTOLIN  HFA) 108 (90 Base) MCG/ACT inhaler 2 puff (has no administration in time range)  methylPREDNISolone  sodium succinate (SOLU-MEDROL ) 125 mg/2 mL injection 125 mg (has no administration in time range)  magnesium  sulfate IVPB 2 g 50 mL (has no  administration in time range)    ED Course/ Medical Decision Making/ A&P  Patient is a 73 year old male with history of asthma/COPD presenting with shortness of breath and wheezing.  Patient arrives here with oxygen  saturations of 77%, but otherwise stable vital signs.  Physical examination reveals diffuse wheezing and mild respiratory distress.  Laboratory studies obtained including CBC, basic metabolic panel, BNP, and troponin.  All of these are basically unremarkable.  COVID/flu/RSV swab all negative.  Chest x-ray showing changes consistent with COPD, but no acute cardiopulmonary abnormality.  Patient has received DuoNeb along with Solu-Medrol  and magnesium .  He is feeling somewhat better and wheezing has improved somewhat, but continues to to have oxygen  saturations in the upper 80s while off of oxygen .  I feel as though he will require admission.  I have discussed care with the hospitalist who agrees to admit.  Final Clinical Impression(s) / ED Diagnoses Final diagnoses:  None    Rx / DC Orders ED Discharge Orders     None         Orvilla Blander, MD 07/22/23 [phone]

## 2023-07-22 NOTE — Plan of Care (Signed)
  Problem: Education: Goal: Knowledge of General Education information will improve Description: Including pain rating scale, medication(s)/side effects and non-pharmacologic comfort measures Outcome: Progressing   Problem: Clinical Measurements: Goal: Diagnostic test results will improve Outcome: Progressing   Problem: Clinical Measurements: Goal: Respiratory complications will improve Outcome: Progressing   Problem: Nutrition: Goal: Adequate nutrition will be maintained Outcome: Progressing

## 2023-07-22 NOTE — H&P (Signed)
 History and Physical    Patient: Brent Collins ZOX:[id number] DOB: November 30, 1950 DOA: 07/22/2023 DOS: the patient was seen and examined on 07/22/2023 PCP: Patient, No Pcp Per  Patient coming from: Home  Chief Complaint:  Chief Complaint  Patient presents with   Shortness of Breath   HPI: Brent Collins is a 73 year old male with history of COPD and hypertension presenting with 2-day history of shortness of breath and worsening cough.  He has subjective fevers and chills at home.  He states that his cough has been largely nonproductive.  He lives with his sister and boyfriend both of whom smoke.  The patient himself states that he quit smoking 20 years ago.  He denies any chest pain, hemoptysis, nausea, vomiting or diarrhea, abdominal pain.  He denies any worsening leg edema.  The patient states that he has been using his his inhaler at home without much improvement.  As result he presented for further evaluation and treatment. In the ED, the patient was afebrile and hemodynamically stable with oxygen  saturation 77% on room air.  He was placed on 2 L with saturation up to 96%.  WBC 8.8, hemoglobin 16.6, platelets 195.  Sodium 142, potassium 4.6, bicarbonate 30, serum creatinine 0.95.  Troponin 8>> 7.  BNP 33.  Chest x-ray showed chronic interstitial markings.  COVID-19 PCR is negative.  EKG shows sinus rhythm with nonspecific T wave changes.  VBG showed 7.3 8/61/35/36.  The patient was started on bronchodilators and Solu-Medrol .  He was mated for further evaluation and treatment of his respiratory failure.  Review of Systems: As mentioned in the history of present illness. All other systems reviewed and are negative. Past Medical History:  Diagnosis Date   Asthma    COPD (chronic obstructive pulmonary disease) (HCC)    Inguinal hernia    History reviewed. No pertinent surgical history. Social History:  reports that he has never smoked. He has never used smokeless tobacco. He reports that he  does not drink alcohol and does not use drugs.  Allergies  Allergen Reactions   Tomato Shortness Of Breath and Nausea And Vomiting    History reviewed. No pertinent family history.  Prior to Admission medications   Medication Sig Start Date End Date Taking? Authorizing Provider  albuterol  (PROVENTIL ) (2.5 MG/3ML) 0.083% nebulizer solution Take 3 mLs (2.5 mg total) by nebulization every 6 (six) hours as needed for wheezing or shortness of breath. 01/26/23   Ninetta Basket, MD  albuterol  (VENTOLIN  HFA) [phone] Base) MCG/ACT inhaler Inhale 2 puffs into the lungs every 4 (four) hours as needed for wheezing or shortness of breath. 01/26/23   Ninetta Basket, MD  Aspirin-Salicylamide-Caffeine [facility] HEADACHE POWDER PO) Take 1 packet by mouth every 6 (six) hours as needed.    [provider]  azithromycin  (ZITHROMAX ) 250 MG tablet Take 1 tablet (250 mg total) by mouth daily. Take first 2 tablets together, then 1 every day until finished. 01/26/23   Ninetta Basket, MD  predniSONE  (DELTASONE ) 50 MG tablet 1 tablet PO QD X4 days 01/26/23   Ninetta Basket, MD    Physical Exam: Vitals:   07/22/23 0515 07/22/23 0530 07/22/23 0600 07/22/23 0700  BP:  122/77 137/81 136/74  Pulse: 74 69 73 78  Resp: 19 16 20 19   Temp:      TempSrc:      SpO2: 96% 95% 99% 98%  Weight:      Height:       GENERAL:  A&O x 3, NAD, well developed, cooperative, follows commands HEENT: [place]/AT, No thrush, No icterus, No oral ulcers Neck:  No neck mass, No meningismus, soft, supple CV: RRR, no S3, no S4, no rub, no JVD Lungs: Bilateral expiratory wheeze.  Diminished breath sounds.  Bilateral rales. Abd: soft/NT +BS, nondistended Ext: No edema, no lymphangitis, no cyanosis, no rashes Neuro:  CN II-XII intact, strength 4/5 in RUE, RLE, strength 4/5 LUE, LLE; sensation intact bilateral; no dysmetria; babinski equivocal  Data Reviewed: Data reviewed above in the history  Assessment and Plan: Acute  respiratory failure with hypoxia and hypercarbia - Secondary to COPD exacerbation - Stable on 2 L - Wean oxygen  for saturation greater 92%  COPD exacerbation - Start Pulmicort  - Start Brovana  - Continue Solu-Medrol  -COVID/RSV/Flu--neg -check viral resp panel  Essential hypertension - Currently not on any medications - Previously took carvedilol  and amlodipine  - Monitor BP for now   Advance Care Planning: FULL  Consults: none  Family Communication: none  Severity of Illness: The appropriate patient status for this patient is OBSERVATION. Observation status is judged to be reasonable and necessary in order to provide the required intensity of service to ensure the patient's safety. The patient's presenting symptoms, physical exam findings, and initial radiographic and laboratory data in the context of their medical condition is felt to place them at decreased risk for further clinical deterioration. Furthermore, it is anticipated that the patient will be medically stable for discharge from the hospital within 2 midnights of admission.   Author: Demaris Fillers, MD 07/22/2023 7:31 AM  For on call review [email or web address].

## 2023-07-22 NOTE — ED Triage Notes (Signed)
 Pt with hx of COPD and comes in with c/o SOB.

## 2023-07-23 ENCOUNTER — Observation Stay (HOSPITAL_COMMUNITY)

## 2023-07-23 DIAGNOSIS — R112 Nausea with vomiting, unspecified: Secondary | ICD-10-CM | POA: Diagnosis not present

## 2023-07-23 DIAGNOSIS — J9602 Acute respiratory failure with hypercapnia: Secondary | ICD-10-CM | POA: Diagnosis not present

## 2023-07-23 DIAGNOSIS — J441 Chronic obstructive pulmonary disease with (acute) exacerbation: Secondary | ICD-10-CM | POA: Diagnosis not present

## 2023-07-23 DIAGNOSIS — J9601 Acute respiratory failure with hypoxia: Secondary | ICD-10-CM | POA: Diagnosis not present

## 2023-07-23 LAB — BASIC METABOLIC PANEL WITH GFR
Anion gap: 11 (ref 5–15)
BUN: 29 mg/dL — ABNORMAL HIGH (ref 8–23)
CO2: 30 mmol/L (ref 22–32)
Calcium: 10 mg/dL (ref 8.9–10.3)
Chloride: 103 mmol/L (ref 98–111)
Creatinine, Ser: 1.01 mg/dL (ref 0.61–1.24)
GFR, Estimated: >60 mL/min (ref >60)
Glucose, Bld: 192 mg/dL — ABNORMAL HIGH (ref 70–99)
Potassium: 4.3 mmol/L (ref 3.5–5.1)
Sodium: 144 mmol/L (ref 135–145)

## 2023-07-23 LAB — GLUCOSE, CAPILLARY: Glucose-Capillary: 241 mg/dL — ABNORMAL HIGH (ref 70–99)

## 2023-07-23 LAB — MAGNESIUM: Magnesium: 2.4 mg/dL (ref 1.7–2.4)

## 2023-07-23 LAB — PHOSPHORUS: Phosphorus: 3.6 mg/dL (ref 2.5–4.6)

## 2023-07-23 MED ORDER — LACTATED RINGERS IV SOLN: INTRAVENOUS | Status: AC

## 2023-07-23 MED ORDER — INSULIN ASPART 100 UNIT/ML IJ SOLN
0.0000 [IU] | Freq: Three times a day (TID) | INTRAMUSCULAR | Status: DC
  Administered 2023-07-24: 3 [IU] via SUBCUTANEOUS
  Administered 2023-07-25: 1 [IU] via SUBCUTANEOUS

## 2023-07-23 MED ORDER — IPRATROPIUM-ALBUTEROL 0.5-2.5 (3) MG/3ML IN SOLN
3.0000 mL | Freq: Three times a day (TID) | RESPIRATORY_TRACT | Status: DC
  Administered 2023-07-23: 3 mL via RESPIRATORY_TRACT
  Filled 2023-07-23: qty 3

## 2023-07-23 MED ORDER — PANTOPRAZOLE SODIUM 40 MG IV SOLR
40.0000 mg | Freq: Two times a day (BID) | INTRAVENOUS | Status: DC
  Administered 2023-07-23 – 2023-07-25 (×4): 40 mg via INTRAVENOUS
  Filled 2023-07-23 (×4): qty 10

## 2023-07-23 MED ORDER — IPRATROPIUM-ALBUTEROL 0.5-2.5 (3) MG/3ML IN SOLN
3.0000 mL | Freq: Two times a day (BID) | RESPIRATORY_TRACT | Status: DC
  Administered 2023-07-23 – 2023-07-25 (×4): 3 mL via RESPIRATORY_TRACT
  Filled 2023-07-23 (×5): qty 3

## 2023-07-23 MED ORDER — IOHEXOL 300 MG/ML  SOLN
100.0000 mL | Freq: Once | INTRAMUSCULAR | Status: AC | PRN
  Administered 2023-07-23: 100 mL via INTRAVENOUS

## 2023-07-23 NOTE — Progress Notes (Signed)
 PROGRESS NOTE  Brent DYKE ZOX:[id number] DOB: 11-20-1950 DOA: 07/22/2023 PCP: Patient, No Pcp Per  Brief History:  73 year old male with history of COPD and hypertension presenting with 2-day history of shortness of breath and worsening cough.  He has subjective fevers and chills at home.  He states that his cough has been largely nonproductive.  He lives with his sister and boyfriend both of whom smoke.  The patient himself states that he quit smoking 20 years ago.  He denies any chest pain, hemoptysis, nausea, vomiting or diarrhea, abdominal pain.  He denies any worsening leg edema.  The patient states that he has been using his his inhaler at home without much improvement.  As result he presented for further evaluation and treatment. In the ED, the patient was afebrile and hemodynamically stable with oxygen  saturation 77% on room air.  He was placed on 2 L with saturation up to 96%.  WBC 8.8, hemoglobin 16.6, platelets 195.  Sodium 142, potassium 4.6, bicarbonate 30, serum creatinine 0.95.  Troponin 8>> 7.  BNP 33.  Chest x-ray showed chronic interstitial markings.  COVID-19 PCR is negative.  EKG shows sinus rhythm with nonspecific T wave changes.  VBG showed 7.3 8/61/35/36.  The patient was started on bronchodilators and Solu-Medrol .  He was mated for further evaluation and treatment of his respiratory failure.   Assessment/Plan: Acute respiratory failure with hypoxia and hypercarbia - Secondary to COPD exacerbation - Stable on 2 L - Wean oxygen  for saturation greater 92%   COPD exacerbation - continue Pulmicort  - Continue Brovana  - Continue Solu-Medrol  - COVID/RSV/Flu--neg -check viral resp panel--neg  Intractable Nausea/Vomiting -Start IVF -change to clear liquid -CT abd/pelvis -start pantoprazole    Essential hypertension - Currently not on any medications - Previously took carvedilol  and amlodipine  - Monitor BP for now  Hyperglycemia -start sliding  scale -check A1C    Family Communication: no  Family at bedside  Consultants:  none  Code Status:  FULL   DVT Prophylaxis:  [place] Lovenox    Procedures: As Listed in Progress Note Above  Antibiotics: None       Subjective: Patient has had 4-5 episodes of n/v last 24 hours.  Has some upper abd pain.  Denies f/c, cp.  States sob is improving.  He has dry cough without hemoptysis.  Objective: Vitals:   07/22/23 2106 07/23/23 0359 07/23/23 0710 07/23/23 1337  BP:  (!) 145/69  (!) 152/84  Pulse:  76  81  Resp:  20  20  Temp:  97.8 F (36.6 C)  97.7 F (36.5 C)  TempSrc:  Oral  Oral  SpO2: 97% 99% 94% 91%  Weight:      Height:        Intake/Output Summary (Last 24 hours) at 07/23/2023 1721 Last data filed at 07/23/2023 1406 Gross per 24 hour  Intake 360 ml  Output --  Net 360 ml   Weight change:  Exam:  General:  Pt is alert, follows commands appropriately, not in acute distress HEENT: No icterus, No thrush, No neck mass, /AT Cardiovascular: RRR, S1/S2, no rubs, no gallops Respiratory: diminished BS, bibasilar rales. No wheeze Abdomen: Soft/+BS, non tender, non distended, no guarding Extremities: No edema, No lymphangitis, No petechiae, No rashes, no synovitis   Data Reviewed: I have personally reviewed following labs and imaging studies Basic Metabolic Panel: Recent Labs  Lab 07/22/23 0413 07/23/23 0453  NA 142 144  K 4.6 4.3  CL 103 103  CO2 30 30  GLUCOSE 150* 192*  BUN 20 29*  CREATININE 0.95 1.01  CALCIUM 9.4 10.0  MG  --  2.4  PHOS  --  3.6   Liver Function Tests: No results for input(s): "AST", "ALT", "ALKPHOS", "BILITOT", "PROT", "ALBUMIN" in the last 168 hours. No results for input(s): "LIPASE", "AMYLASE" in the last 168 hours. No results for input(s): "AMMONIA" in the last 168 hours. Coagulation Profile: No results for input(s): "INR", "PROTIME" in the last 168 hours. CBC: Recent Labs  Lab 07/22/23 0413  WBC 8.8  HGB 16.6  HCT  49.1  MCV 92.6  PLT 195   Cardiac Enzymes: No results for input(s): "CKTOTAL", "CKMB", "CKMBINDEX", "TROPONINI" in the last 168 hours. BNP: Invalid input(s): "POCBNP" CBG: No results for input(s): "GLUCAP" in the last 168 hours. HbA1C: No results for input(s): "HGBA1C" in the last 72 hours. Urine analysis: No results found for: "COLORURINE", "APPEARANCEUR", "LABSPEC", "PHURINE", "GLUCOSEU", "HGBUR", "BILIRUBINUR", "KETONESUR", "PROTEINUR", "UROBILINOGEN", "NITRITE", "LEUKOCYTESUR" Sepsis Labs: @LABRCNTIP (procalcitonin:4,lacticidven:4) ) Recent Results (from the past 240 hours)  Resp panel by RT-PCR (RSV, Flu A&B, Covid) Anterior Nasal Swab     Status: None   Collection Time: 07/22/23  4:23 AM   Specimen: Anterior Nasal Swab  Result Value Ref Range Status   SARS Coronavirus 2 by RT PCR NEGATIVE NEGATIVE Final    Comment: (NOTE) SARS-CoV-2 target nucleic acids are NOT DETECTED.  The SARS-CoV-2 RNA is generally detectable in upper respiratory specimens during the acute phase of infection. The lowest concentration of SARS-CoV-2 viral copies this assay can detect is 138 copies/mL. A negative result does not preclude SARS-Cov-2 infection and should not be used as the sole basis for treatment or other patient management decisions. A negative result may occur with  improper specimen collection/handling, submission of specimen other than nasopharyngeal swab, presence of viral mutation(s) within the areas targeted by this assay, and inadequate number of viral copies(<138 copies/mL). A negative result must be combined with clinical observations, patient history, and epidemiological information. The expected result is Negative.  Fact Sheet for Patients:  BloggerCourse.com  Fact Sheet for Healthcare Providers:  SeriousBroker.it  This test is no t yet approved or cleared by the [place]  FDA and  has been authorized for detection  and/or diagnosis of SARS-CoV-2 by FDA under an Emergency Use Authorization (EUA). This EUA will remain  in effect (meaning this test can be used) for the duration of the COVID-19 declaration under Section 564(b)(1) of the Act, 21 U.S.C.section 360bbb-3(b)(1), unless the authorization is terminated  or revoked sooner.       Influenza A by PCR NEGATIVE NEGATIVE Final   Influenza B by PCR NEGATIVE NEGATIVE Final    Comment: (NOTE) The Xpert Xpress SARS-CoV-2/FLU/RSV plus assay is intended as an aid in the diagnosis of influenza from Nasopharyngeal swab specimens and should not be used as a sole basis for treatment. Nasal washings and aspirates are unacceptable for Xpert Xpress SARS-CoV-2/FLU/RSV testing.  Fact Sheet for Patients: BloggerCourse.com  Fact Sheet for Healthcare Providers: SeriousBroker.it  This test is not yet approved or cleared by the [place]  FDA and has been authorized for detection and/or diagnosis of SARS-CoV-2 by FDA under an Emergency Use Authorization (EUA). This EUA will remain in effect (meaning this test can be used) for the duration of the COVID-19 declaration under Section 564(b)(1) of the Act, 21 U.S.C. section 360bbb-3(b)(1), unless the authorization is terminated or revoked.     Resp Syncytial Virus by  PCR NEGATIVE NEGATIVE Final    Comment: (NOTE) Fact Sheet for Patients: BloggerCourse.com  Fact Sheet for Healthcare Providers: SeriousBroker.it  This test is not yet approved or cleared by the [place]  FDA and has been authorized for detection and/or diagnosis of SARS-CoV-2 by FDA under an Emergency Use Authorization (EUA). This EUA will remain in effect (meaning this test can be used) for the duration of the COVID-19 declaration under Section 564(b)(1) of the Act, 21 U.S.C. section 360bbb-3(b)(1), unless the authorization is terminated  or revoked.  Performed at [facility], [address] [place], [place] [address]   Respiratory (~20 pathogens) panel by PCR     Status: None   Collection Time: 07/22/23  8:45 AM   Specimen: Nasopharyngeal Swab; Respiratory  Result Value Ref Range Status   Adenovirus NOT DETECTED NOT DETECTED Final   Coronavirus 229E NOT DETECTED NOT DETECTED Final    Comment: (NOTE) The Coronavirus on the Respiratory Panel, DOES NOT test for the novel  Coronavirus (2019 nCoV)    Coronavirus HKU1 NOT DETECTED NOT DETECTED Final   Coronavirus NL63 NOT DETECTED NOT DETECTED Final   Coronavirus OC43 NOT DETECTED NOT DETECTED Final   Metapneumovirus NOT DETECTED NOT DETECTED Final   Rhinovirus / Enterovirus NOT DETECTED NOT DETECTED Final   Influenza A NOT DETECTED NOT DETECTED Final   Influenza B NOT DETECTED NOT DETECTED Final   Parainfluenza Virus 1 NOT DETECTED NOT DETECTED Final   Parainfluenza Virus 2 NOT DETECTED NOT DETECTED Final   Parainfluenza Virus 3 NOT DETECTED NOT DETECTED Final   Parainfluenza Virus 4 NOT DETECTED NOT DETECTED Final   Respiratory Syncytial Virus NOT DETECTED NOT DETECTED Final   Bordetella pertussis NOT DETECTED NOT DETECTED Final   Bordetella Parapertussis NOT DETECTED NOT DETECTED Final   Chlamydophila pneumoniae NOT DETECTED NOT DETECTED Final   Mycoplasma pneumoniae NOT DETECTED NOT DETECTED Final    Comment: Performed at [facility], [address] [place], [place] [address]     Scheduled Meds:  arformoterol   15 mcg Nebulization BID   budesonide  (PULMICORT ) nebulizer solution  0.5 mg Nebulization BID   enoxaparin  (LOVENOX ) injection  40 mg Subcutaneous Q24H   ipratropium-albuterol   3 mL Nebulization BID   methylPREDNISolone  (SOLU-MEDROL ) injection  40 mg Intravenous Q24H   pantoprazole  (PROTONIX ) IV  40 mg Intravenous Q12H   Continuous Infusions:  Procedures/Studies: DG Chest Portable 1 View Result Date: 07/22/2023 CLINICAL DATA:  73 year old  male with shortness of breath.  COPD. EXAM: PORTABLE CHEST 1 VIEW COMPARISON:  Chest radiographs 01/26/2023 and earlier. FINDINGS: Portable AP upright view at 0404 hours. Larger lung volumes compared to October. Coarse chronic symmetric pulmonary interstitium. Normal cardiac size and mediastinal contours. Visualized tracheal air column is within normal limits. No pneumothorax, pleural effusion or confluent lung opacity. Incidental right nipple shadow. Advanced chronic degeneration at both shoulders. No acute osseous abnormality identified. Negative visible bowel gas. IMPRESSION: Chronic pulmonary interstitial changes. No acute cardiopulmonary abnormality. Electronically Signed   By: Marlise Simpers M.D.   On: 07/22/2023 05:39    Demaris Fillers, DO  [facility]  If 7PM-7AM, please contact night-coverage [email or web address] Password [email or web address] 07/23/2023, 5:21 PM   LOS: 0 days

## 2023-07-23 NOTE — Care Management Obs Status (Signed)
 MEDICARE OBSERVATION STATUS NOTIFICATION   Patient Details  Name: Brent Collins MRN: [id number] Date of Birth: [birth year]   Medicare Observation Status Notification Given:  Yes    Geraldina Klinefelter, RN 07/23/2023, 3:00 PM

## 2023-07-23 NOTE — Progress Notes (Signed)
 Patient c/o nausea/vomiting. Patient received 4 mg IV zofran .

## 2023-07-24 ENCOUNTER — Observation Stay (HOSPITAL_COMMUNITY)

## 2023-07-24 DIAGNOSIS — R112 Nausea with vomiting, unspecified: Secondary | ICD-10-CM

## 2023-07-24 DIAGNOSIS — J441 Chronic obstructive pulmonary disease with (acute) exacerbation: Secondary | ICD-10-CM | POA: Diagnosis present

## 2023-07-24 DIAGNOSIS — I10 Essential (primary) hypertension: Secondary | ICD-10-CM | POA: Diagnosis present

## 2023-07-24 DIAGNOSIS — J9602 Acute respiratory failure with hypercapnia: Secondary | ICD-10-CM | POA: Diagnosis present

## 2023-07-24 DIAGNOSIS — K56609 Unspecified intestinal obstruction, unspecified as to partial versus complete obstruction: Secondary | ICD-10-CM | POA: Diagnosis present

## 2023-07-24 DIAGNOSIS — K56600 Partial intestinal obstruction, unspecified as to cause: Secondary | ICD-10-CM | POA: Diagnosis present

## 2023-07-24 DIAGNOSIS — K4090 Unilateral inguinal hernia, without obstruction or gangrene, not specified as recurrent: Secondary | ICD-10-CM | POA: Diagnosis present

## 2023-07-24 DIAGNOSIS — E1165 Type 2 diabetes mellitus with hyperglycemia: Secondary | ICD-10-CM | POA: Diagnosis present

## 2023-07-24 DIAGNOSIS — Z79899 Other long term (current) drug therapy: Secondary | ICD-10-CM | POA: Diagnosis not present

## 2023-07-24 DIAGNOSIS — Z1152 Encounter for screening for COVID-19: Secondary | ICD-10-CM | POA: Diagnosis not present

## 2023-07-24 DIAGNOSIS — Z87891 Personal history of nicotine dependence: Secondary | ICD-10-CM | POA: Diagnosis not present

## 2023-07-24 DIAGNOSIS — Z7982 Long term (current) use of aspirin: Secondary | ICD-10-CM | POA: Diagnosis not present

## 2023-07-24 DIAGNOSIS — R1900 Intra-abdominal and pelvic swelling, mass and lump, unspecified site: Secondary | ICD-10-CM | POA: Diagnosis present

## 2023-07-24 DIAGNOSIS — Z91018 Allergy to other foods: Secondary | ICD-10-CM | POA: Diagnosis not present

## 2023-07-24 DIAGNOSIS — J9601 Acute respiratory failure with hypoxia: Secondary | ICD-10-CM | POA: Diagnosis present

## 2023-07-24 LAB — URINALYSIS, W/ REFLEX TO CULTURE (INFECTION SUSPECTED)
Bacteria, UA: NONE SEEN
Bilirubin Urine: NEGATIVE
Glucose, UA: >=500 mg/dL — AB
Hgb urine dipstick: NEGATIVE
Ketones, ur: NEGATIVE mg/dL
Leukocytes,Ua: NEGATIVE
Nitrite: NEGATIVE
Protein, ur: NEGATIVE mg/dL
Specific Gravity, Urine: 1.03 (ref 1.005–1.030)
pH: 7 (ref 5.0–8.0)

## 2023-07-24 LAB — COMPREHENSIVE METABOLIC PANEL WITH GFR
ALT: 10 U/L (ref 0–44)
AST: 16 U/L (ref 15–41)
Albumin: 3.5 g/dL (ref 3.5–5.0)
Alkaline Phosphatase: 63 U/L (ref 38–126)
Anion gap: 9 (ref 5–15)
BUN: 32 mg/dL — ABNORMAL HIGH (ref 8–23)
CO2: 32 mmol/L (ref 22–32)
Calcium: 8.9 mg/dL (ref 8.9–10.3)
Chloride: 100 mmol/L (ref 98–111)
Creatinine, Ser: 1 mg/dL (ref 0.61–1.24)
GFR, Estimated: >60 mL/min (ref >60)
Glucose, Bld: 150 mg/dL — ABNORMAL HIGH (ref 70–99)
Potassium: 4.5 mmol/L (ref 3.5–5.1)
Sodium: 141 mmol/L (ref 135–145)
Total Bilirubin: 0.5 mg/dL (ref 0.0–1.2)
Total Protein: 6.4 g/dL — ABNORMAL LOW (ref 6.5–8.1)

## 2023-07-24 LAB — GLUCOSE, CAPILLARY
Glucose-Capillary: 115 mg/dL — ABNORMAL HIGH (ref 70–99)
Glucose-Capillary: 140 mg/dL — ABNORMAL HIGH (ref 70–99)
Glucose-Capillary: 209 mg/dL — ABNORMAL HIGH (ref 70–99)
Glucose-Capillary: 159 mg/dL — ABNORMAL HIGH (ref 70–99)

## 2023-07-24 LAB — LIPASE, BLOOD: Lipase: 27 U/L (ref 11–51)

## 2023-07-24 LAB — MAGNESIUM: Magnesium: 2 mg/dL (ref 1.7–2.4)

## 2023-07-24 LAB — HEMOGLOBIN A1C
Hgb A1c MFr Bld: 7.5 % — ABNORMAL HIGH (ref 4.8–5.6)
Mean Plasma Glucose: 168.55 mg/dL

## 2023-07-24 MED ORDER — SENNOSIDES-DOCUSATE SODIUM 8.6-50 MG PO TABS
1.0000 | ORAL_TABLET | Freq: Two times a day (BID) | ORAL | Status: DC
  Administered 2023-07-24 – 2023-07-25 (×3): 1 via ORAL
  Filled 2023-07-24 (×3): qty 1

## 2023-07-24 MED ORDER — POLYETHYLENE GLYCOL 3350 17 G PO PACK
17.0000 g | PACK | Freq: Every day | ORAL | Status: DC
  Administered 2023-07-24 – 2023-07-25 (×2): 17 g via ORAL
  Filled 2023-07-24 (×2): qty 1

## 2023-07-24 MED ORDER — BISACODYL 10 MG RE SUPP
10.0000 mg | Freq: Every day | RECTAL | Status: DC
  Administered 2023-07-24 – 2023-07-25 (×2): 10 mg via RECTAL
  Filled 2023-07-24 (×2): qty 1

## 2023-07-24 NOTE — Plan of Care (Signed)

## 2023-07-24 NOTE — Progress Notes (Signed)
 PROGRESS NOTE  Brent Collins ZOX:[id number] DOB: 1950-06-14 DOA: 07/22/2023 PCP: Patient, No Pcp Per  Brief History:  73 year old male with history of COPD and hypertension presenting with 2-day history of shortness of breath and worsening cough.  He has subjective fevers and chills at home.  He states that his cough has been largely nonproductive.  He lives with his sister and boyfriend both of whom smoke.  The patient himself states that he quit smoking 20 years ago.  He denies any chest pain, hemoptysis, nausea, vomiting or diarrhea, abdominal pain.  He denies any worsening leg edema.  The patient states that he has been using his his inhaler at home without much improvement.  As result he presented for further evaluation and treatment. In the ED, the patient was afebrile and hemodynamically stable with oxygen  saturation 77% on room air.  He was placed on 2 L with saturation up to 96%.  WBC 8.8, hemoglobin 16.6, platelets 195.  Sodium 142, potassium 4.6, bicarbonate 30, serum creatinine 0.95.  Troponin 8>> 7.  BNP 33.  Chest x-ray showed chronic interstitial markings.  COVID-19 PCR is negative.  EKG shows sinus rhythm with nonspecific T wave changes.  VBG showed 7.3 8/61/35/36.  The patient was started on bronchodilators and Solu-Medrol .  He was mated for further evaluation and treatment of his respiratory failure.  Unfortunately, his discharge is delayed due to development of recurrent N/V during the hospitalization.   CT abd/pelvis ordered.  The CT showed small bowel obstruction with abrupt transition point in the right central abdomen.  There is also a heterogenous mass with internal calcifications measuring 6.0 x 6.4 cm in the pelvis that was concerning for possible neoplasm.  Nonemergent pelvic MRI was recommended.  General surgery was consulted to assist with management.   Assessment/Plan: Acute respiratory failure with hypoxia and hypercarbia - Secondary to COPD exacerbation -  Stable on 2 L - Wean oxygen  for saturation greater 92%   COPD exacerbation - continue Pulmicort  - Continue Brovana  - Continue Solu-Medrol  - COVID/RSV/Flu--neg -check viral resp panel--neg   Partial small bowel obstruction -Start IVF -changed to clear liquid -CT abd/pelvis--as discussed above - Appreciate general surgery consult   Essential hypertension - Currently not on any medications - Previously took carvedilol  and amlodipine  - Monitor BP for now   Diabetes mellitus type 2 -start sliding scale -check A1C--7.5  Pelvic mass -Patient will need nonemergent pelvic MRI          Family Communication:   Family at bedside  Consultants:  general surgery  Code Status:  FULL   DVT Prophylaxis:  [place] Lovenox    Procedures: As Listed in Progress Note Above  Antibiotics: None        Subjective: Patient states that he is breathing better.  He states that his emesis has improved.  He is passing flatus.  No bowel movement.  Denies any chest pain, abdominal pain.  Objective: Vitals:   07/24/23 0813 07/24/23 0816 07/24/23 0821 07/24/23 1610  BP:    133/65  Pulse:    75  Resp:      Temp:    98.1 F (36.7 C)  TempSrc:    Oral  SpO2: 97% 100% 100% 95%  Weight:      Height:        Intake/Output Summary (Last 24 hours) at 07/24/2023 1820 Last data filed at 07/24/2023 1500 Gross per 24 hour  Intake 480 ml  Output --  Net 480 ml   Weight change:  Exam:  General:  Pt is alert, follows commands appropriately, not in acute distress HEENT: No icterus, No thrush, No neck mass, [place]/AT Cardiovascular: RRR, S1/S2, no rubs, no gallops Respiratory: The breath sounds.  Bibasilar rales.  No wheezing. Abdomen: Soft/+BS, non tender, non distended, no guarding Extremities: No edema, No lymphangitis, No petechiae, No rashes, no synovitis   Data Reviewed: I have personally reviewed following labs and imaging studies Basic Metabolic Panel: Recent Labs  Lab  07/22/23 0413 07/23/23 0453 07/24/23 0510  NA 142 144 141  K 4.6 4.3 4.5  CL 103 103 100  CO2 30 30 32  GLUCOSE 150* 192* 150*  BUN 20 29* 32*  CREATININE 0.95 1.01 1.00  CALCIUM 9.4 10.0 8.9  MG  --  2.4 2.0  PHOS  --  3.6  --    Liver Function Tests: Recent Labs  Lab 07/24/23 0510  AST 16  ALT 10  ALKPHOS 63  BILITOT 0.5  PROT 6.4*  ALBUMIN 3.5   Recent Labs  Lab 07/24/23 0510  LIPASE 27   No results for input(s): "AMMONIA" in the last 168 hours. Coagulation Profile: No results for input(s): "INR", "PROTIME" in the last 168 hours. CBC: Recent Labs  Lab 07/22/23 0413  WBC 8.8  HGB 16.6  HCT 49.1  MCV 92.6  PLT 195   Cardiac Enzymes: No results for input(s): "CKTOTAL", "CKMB", "CKMBINDEX", "TROPONINI" in the last 168 hours. BNP: Invalid input(s): "POCBNP" CBG: Recent Labs  Lab 07/23/23 2056 07/24/23 0711 07/24/23 1113 07/24/23 1608  GLUCAP 241* 115* 140* 209*   HbA1C: Recent Labs    07/24/23 0510  HGBA1C 7.5*   Urine analysis:    Component Value Date/Time   COLORURINE YELLOW 07/24/2023 1211   APPEARANCEUR CLEAR 07/24/2023 1211   LABSPEC 1.030 07/24/2023 1211   PHURINE 7.0 07/24/2023 1211   GLUCOSEU >=500 (A) 07/24/2023 1211   HGBUR NEGATIVE 07/24/2023 1211   BILIRUBINUR NEGATIVE 07/24/2023 1211   KETONESUR NEGATIVE 07/24/2023 1211   PROTEINUR NEGATIVE 07/24/2023 1211   NITRITE NEGATIVE 07/24/2023 1211   LEUKOCYTESUR NEGATIVE 07/24/2023 1211   Sepsis Labs: @LABRCNTIP (procalcitonin:4,lacticidven:4) ) Recent Results (from the past 240 hours)  Resp panel by RT-PCR (RSV, Flu A&B, Covid) Anterior Nasal Swab     Status: None   Collection Time: 07/22/23  4:23 AM   Specimen: Anterior Nasal Swab  Result Value Ref Range Status   SARS Coronavirus 2 by RT PCR NEGATIVE NEGATIVE Final    Comment: (NOTE) SARS-CoV-2 target nucleic acids are NOT DETECTED.  The SARS-CoV-2 RNA is generally detectable in upper respiratory specimens during the  acute phase of infection. The lowest concentration of SARS-CoV-2 viral copies this assay can detect is 138 copies/mL. A negative result does not preclude SARS-Cov-2 infection and should not be used as the sole basis for treatment or other patient management decisions. A negative result may occur with  improper specimen collection/handling, submission of specimen other than nasopharyngeal swab, presence of viral mutation(s) within the areas targeted by this assay, and inadequate number of viral copies(<138 copies/mL). A negative result must be combined with clinical observations, patient history, and epidemiological information. The expected result is Negative.  Fact Sheet for Patients:  BloggerCourse.com  Fact Sheet for Healthcare Providers:  SeriousBroker.it  This test is no t yet approved or cleared by the [place]  FDA and  has been authorized for detection and/or diagnosis of SARS-CoV-2 by FDA under an Emergency Use Authorization (EUA).  This EUA will remain  in effect (meaning this test can be used) for the duration of the COVID-19 declaration under Section 564(b)(1) of the Act, 21 U.S.C.section 360bbb-3(b)(1), unless the authorization is terminated  or revoked sooner.       Influenza A by PCR NEGATIVE NEGATIVE Final   Influenza B by PCR NEGATIVE NEGATIVE Final    Comment: (NOTE) The Xpert Xpress SARS-CoV-2/FLU/RSV plus assay is intended as an aid in the diagnosis of influenza from Nasopharyngeal swab specimens and should not be used as a sole basis for treatment. Nasal washings and aspirates are unacceptable for Xpert Xpress SARS-CoV-2/FLU/RSV testing.  Fact Sheet for Patients: BloggerCourse.com  Fact Sheet for Healthcare Providers: SeriousBroker.it  This test is not yet approved or cleared by the [place]  FDA and has been authorized for detection and/or  diagnosis of SARS-CoV-2 by FDA under an Emergency Use Authorization (EUA). This EUA will remain in effect (meaning this test can be used) for the duration of the COVID-19 declaration under Section 564(b)(1) of the Act, 21 U.S.C. section 360bbb-3(b)(1), unless the authorization is terminated or revoked.     Resp Syncytial Virus by PCR NEGATIVE NEGATIVE Final    Comment: (NOTE) Fact Sheet for Patients: BloggerCourse.com  Fact Sheet for Healthcare Providers: SeriousBroker.it  This test is not yet approved or cleared by the [place]  FDA and has been authorized for detection and/or diagnosis of SARS-CoV-2 by FDA under an Emergency Use Authorization (EUA). This EUA will remain in effect (meaning this test can be used) for the duration of the COVID-19 declaration under Section 564(b)(1) of the Act, 21 U.S.C. section 360bbb-3(b)(1), unless the authorization is terminated or revoked.  Performed at [facility], [address], [place], [place] [address]   Respiratory (~20 pathogens) panel by PCR     Status: None   Collection Time: 07/22/23  8:45 AM   Specimen: Nasopharyngeal Swab; Respiratory  Result Value Ref Range Status   Adenovirus NOT DETECTED NOT DETECTED Final   Coronavirus 229E NOT DETECTED NOT DETECTED Final    Comment: (NOTE) The Coronavirus on the Respiratory Panel, DOES NOT test for the novel  Coronavirus (2019 nCoV)    Coronavirus HKU1 NOT DETECTED NOT DETECTED Final   Coronavirus NL63 NOT DETECTED NOT DETECTED Final   Coronavirus OC43 NOT DETECTED NOT DETECTED Final   Metapneumovirus NOT DETECTED NOT DETECTED Final   Rhinovirus / Enterovirus NOT DETECTED NOT DETECTED Final   Influenza A NOT DETECTED NOT DETECTED Final   Influenza B NOT DETECTED NOT DETECTED Final   Parainfluenza Virus 1 NOT DETECTED NOT DETECTED Final   Parainfluenza Virus 2 NOT DETECTED NOT DETECTED Final   Parainfluenza Virus 3 NOT DETECTED NOT  DETECTED Final   Parainfluenza Virus 4 NOT DETECTED NOT DETECTED Final   Respiratory Syncytial Virus NOT DETECTED NOT DETECTED Final   Bordetella pertussis NOT DETECTED NOT DETECTED Final   Bordetella Parapertussis NOT DETECTED NOT DETECTED Final   Chlamydophila pneumoniae NOT DETECTED NOT DETECTED Final   Mycoplasma pneumoniae NOT DETECTED NOT DETECTED Final    Comment: Performed at [facility], [address] [place], [place] [address]     Scheduled Meds:  arformoterol   15 mcg Nebulization BID   bisacodyl   10 mg Rectal Daily   budesonide  (PULMICORT ) nebulizer solution  0.5 mg Nebulization BID   enoxaparin  (LOVENOX ) injection  40 mg Subcutaneous Q24H   insulin  aspart  0-9 Units Subcutaneous TID WC   ipratropium-albuterol   3 mL Nebulization BID  methylPREDNISolone  (SOLU-MEDROL ) injection  40 mg Intravenous Q24H   pantoprazole  (PROTONIX ) IV  40 mg Intravenous Q12H   polyethylene glycol  17 g Oral Daily   senna-docusate  1 tablet Oral BID   Continuous Infusions:  lactated ringers  75 mL/hr at 07/23/23 1756    Procedures/Studies: DG Abd 1 View Result Date: 07/24/2023 CLINICAL DATA:  Abdominal distension EXAM: ABDOMEN - 1 VIEW COMPARISON:  CT from the previous day. FINDINGS: Administered contrast now lies within the colon consistent with a partial small bowel obstruction. Mild persistent small bowel dilatation is noted in the right hemiabdomen. No free air is seen. No bony abnormality is noted. IMPRESSION: Changes consistent with partial small bowel obstruction. The degree of small-bowel dilatation has improved from the prior CT. Electronically Signed   By: Violeta Grey M.D.   On: 07/24/2023 13:55   CT ABDOMEN PELVIS W CONTRAST Result Date: 07/23/2023 CLINICAL DATA:  Acute nonlocalized abdominal pain and recurrent vomiting EXAM: CT ABDOMEN AND PELVIS WITH CONTRAST TECHNIQUE: Multidetector CT imaging of the abdomen and pelvis was performed using the standard protocol following bolus  administration of intravenous contrast. RADIATION DOSE REDUCTION: This exam was performed according to the departmental dose-optimization program which includes automated exposure control, adjustment of the mA and/or kV according to patient size and/or use of iterative reconstruction technique. CONTRAST:  OMNIPAQUE  IOHEXOL  300 MG/ML  SOLN COMPARISON:  None Available. FINDINGS: Lower chest: No acute abnormality. Hepatobiliary: No focal liver abnormality is seen. No gallstones, gallbladder wall thickening, or biliary dilatation. Pancreas: Unremarkable. Spleen: Unremarkable. Adrenals/Urinary Tract: Normal adrenal glands. No urinary calculi or hydronephrosis. Unremarkable bladder. Stomach/Bowel: Distended stomach. Enteric contrast within the stomach and proximal small bowel upstream from dilated loops of small bowel with abrupt transition point in the right central abdomen (circa series 4/image 60). The bowel downstream from the transition point is decompressed. Normal caliber colon. The sigmoid colon herniates into the left inguinal hernia. No evidence of colonic obstruction. Normal appendix. Vascular/Lymphatic: Mild aortic atherosclerotic calcification. No lymphadenopathy. There is a heterogenous mass with internal calcification measuring 6.0 x 6.4 cm in the pelvis (series 2/image 69). Reproductive: Enlarged prostate. Other: No free intraperitoneal fluid or air. Musculoskeletal: No acute fracture. IMPRESSION: 1. Small-bowel obstruction with abrupt transition point in the right central abdomen. 2. Heterogenous mass with internal calcification measuring 6.0 x 6.4 cm in the pelvis. This is concerning for neoplasm. Recommend further evaluation with nonemergent pelvic MRI with and without contrast. 3. The sigmoid colon herniates into the left inguinal hernia. No evidence of colonic obstruction. 4. Enlarged prostate. 5. Aortic Atherosclerosis (ICD10-I70.0). Electronically Signed   By: Rozell Cornet M.D.   On:  07/23/2023 23:39   DG Chest Portable 1 View Result Date: 07/22/2023 CLINICAL DATA:  73 year old male with shortness of breath.  COPD. EXAM: PORTABLE CHEST 1 VIEW COMPARISON:  Chest radiographs 01/26/2023 and earlier. FINDINGS: Portable AP upright view at 0404 hours. Larger lung volumes compared to October. Coarse chronic symmetric pulmonary interstitium. Normal cardiac size and mediastinal contours. Visualized tracheal air column is within normal limits. No pneumothorax, pleural effusion or confluent lung opacity. Incidental right nipple shadow. Advanced chronic degeneration at both shoulders. No acute osseous abnormality identified. Negative visible bowel gas. IMPRESSION: Chronic pulmonary interstitial changes. No acute cardiopulmonary abnormality. Electronically Signed   By: Marlise Simpers M.D.   On: 07/22/2023 05:39    Demaris Fillers, DO  [facility]  If 7PM-7AM, please contact night-coverage [email or web address] Password [email or web address] 07/24/2023, 6:20 PM   LOS: 0 days

## 2023-07-24 NOTE — Discharge Instructions (Signed)

## 2023-07-24 NOTE — Consult Note (Signed)
 [facility] Consult  Reason for Consult: Small bowel obstruction Referring Physician: Dr. Winferd Hatter  Chief Complaint   Shortness of Breath     HPI: Brent Collins is a 73 y.o. male who was admitted with a COPD exacerbation.  He was noted to have some intractable nausea and vomiting.  His diet was dropped down to clear liquids and CT of the abdomen and pelvis was ordered.  CT of the abdomen and pelvis with oral contrast yesterday demonstrated concern for small bowel obstruction with an abrupt transition point in the right central abdomen with contrast within the proximal small bowel, left inguinal hernia containing herniated sigmoid colon without evidence of colonic obstruction, and a heterogeneous mass with internal calcification within the pelvis concerning for neoplasm.  His past medical history significant for asthma/COPD not on oxygen  and a known left inguinal hernia.  He denies any history of abdominal surgeries.  He denies any history of blood thinners.  He states that he still has some abdominal pain, but it is slightly improved since yesterday.  He has not had any emesis today.  His last episode of emesis was last night.  He confirms passing flatus this morning.  His last bowel movement was on Sunday.  He denies any history of previous bowel obstructive symptoms in the past.  He has a known history of a left inguinal hernia, and states that he intermittently will have pain associated with the hernia, but the hernia is currently nontender.  Past Medical History:  Diagnosis Date   Asthma    COPD (chronic obstructive pulmonary disease) (HCC)    Inguinal hernia     History reviewed. No pertinent surgical history.  History reviewed. No pertinent family history.  Social History   Tobacco Use   Smoking status: Never   Smokeless tobacco: Never  Vaping Use   Vaping status: Never Used  Substance Use Topics   Alcohol use: No   Drug use: No    Medications: I have  reviewed the patient's current medications.  Allergies  Allergen Reactions   Tomato Shortness Of Breath and Nausea And Vomiting     ROS:  Pertinent items are noted in HPI.  Blood pressure (!) 152/73, pulse 63, temperature 97.8 F (36.6 C), temperature source Oral, resp. rate 18, height 5\' 3"  (1.6 m), weight 59 kg, SpO2 100%. Physical Exam Vitals reviewed.  Constitutional:      Appearance: He is well-developed.  HENT:     Head: Normocephalic and atraumatic.  Eyes:     Extraocular Movements: Extraocular movements intact.     Pupils: Pupils are equal, round, and reactive to light.  Cardiovascular:     Rate and Rhythm: Normal rate.  Pulmonary:     Effort: Pulmonary effort is normal.  Abdominal:     Comments: Abdomen soft, nondistended, no percussion tenderness, nontender to palpation; no rigidity, guarding, rebound tenderness; soft and reducible left inguinal hernia, nontender to palpation  Musculoskeletal:        General: Normal range of motion.     Cervical back: Normal range of motion.  Skin:    General: Skin is warm and dry.  Neurological:     General: No focal deficit present.     Mental Status: He is alert and oriented to person, place, and time.  Psychiatric:        Mood and Affect: Mood normal.        Behavior: Behavior normal.     Results: Results for orders placed or  performed during the hospital encounter of 07/22/23 (from the past 48 hours)  Basic metabolic panel     Status: Abnormal   Collection Time: 07/23/23  4:53 AM  Result Value Ref Range   Sodium 144 135 - 145 mmol/L   Potassium 4.3 3.5 - 5.1 mmol/L   Chloride 103 98 - 111 mmol/L   CO2 30 22 - 32 mmol/L   Glucose, Bld 192 (H) 70 - 99 mg/dL    Comment: Glucose reference range applies only to samples taken after fasting for at least 8 hours.   BUN 29 (H) 8 - 23 mg/dL   Creatinine, Ser 4.09 0.61 - 1.24 mg/dL   Calcium 81.1 8.9 - 91.4 mg/dL   GFR, Estimated >78 >29 mL/min    Comment:  (NOTE) Calculated using the CKD-EPI Creatinine Equation (2021)    Anion gap 11 5 - 15    Comment: Performed at [facility], [address] [place], [place] [address]  Magnesium      Status: None   Collection Time: 07/23/23  4:53 AM  Result Value Ref Range   Magnesium  2.4 1.7 - 2.4 mg/dL    Comment: Performed at [facility], [address] [place], [place] [address]  Phosphorus     Status: None   Collection Time: 07/23/23  4:53 AM  Result Value Ref Range   Phosphorus 3.6 2.5 - 4.6 mg/dL    Comment: Performed at [facility], [address], [place], [place] [address]  Glucose, capillary     Status: Abnormal   Collection Time: 07/23/23  8:56 PM  Result Value Ref Range   Glucose-Capillary 241 (H) 70 - 99 mg/dL    Comment: Glucose reference range applies only to samples taken after fasting for at least 8 hours.   Comment 1 Notify RN    Comment 2 Document in Chart   Comprehensive metabolic panel with GFR     Status: Abnormal   Collection Time: 07/24/23  5:10 AM  Result Value Ref Range   Sodium 141 135 - 145 mmol/L   Potassium 4.5 3.5 - 5.1 mmol/L   Chloride 100 98 - 111 mmol/L   CO2 32 22 - 32 mmol/L   Glucose, Bld 150 (H) 70 - 99 mg/dL    Comment: Glucose reference range applies only to samples taken after fasting for at least 8 hours.   BUN 32 (H) 8 - 23 mg/dL   Creatinine, Ser 2.95 0.61 - 1.24 mg/dL   Calcium 8.9 8.9 - 28.4 mg/dL   Total Protein 6.4 (L) 6.5 - 8.1 g/dL   Albumin 3.5 3.5 - 5.0 g/dL   AST 16 15 - 41 U/L   ALT 10 0 - 44 U/L   Alkaline Phosphatase 63 38 - 126 U/L   Total Bilirubin 0.5 0.0 - 1.2 mg/dL   GFR, Estimated >13 >24 mL/min    Comment: (NOTE) Calculated using the CKD-EPI Creatinine Equation (2021)    Anion gap 9 5 - 15    Comment: Performed at [facility], [address] [place], [place] [address]  Magnesium      Status: None   Collection Time: 07/24/23  5:10 AM  Result Value Ref Range   Magnesium  2.0 1.7 - 2.4 mg/dL    Comment: Performed at [facility], [address], [place], [place] [address]  Lipase, blood     Status: None   Collection Time: 07/24/23  5:10 AM  Result Value Ref Range   Lipase 27 11 - 51  U/L    Comment: Performed at [facility], [address] [place], [place] [address]  Hemoglobin A1c     Status: Abnormal   Collection Time: 07/24/23  5:10 AM  Result Value Ref Range   Hgb A1c MFr Bld 7.5 (H) 4.8 - 5.6 %    Comment: (NOTE) Pre diabetes:          5.7%-6.4%  Diabetes:              >6.4%  Glycemic control for   <7.0% adults with diabetes    Mean Plasma Glucose 168.55 mg/dL    Comment: Performed at [facility], [address], [place], [place] [address]  Glucose, capillary     Status: Abnormal   Collection Time: 07/24/23  7:11 AM  Result Value Ref Range   Glucose-Capillary 115 (H) 70 - 99 mg/dL    Comment: Glucose reference range applies only to samples taken after fasting for at least 8 hours.    CT ABDOMEN PELVIS W CONTRAST Result Date: 07/23/2023 CLINICAL DATA:  Acute nonlocalized abdominal pain and recurrent vomiting EXAM: CT ABDOMEN AND PELVIS WITH CONTRAST TECHNIQUE: Multidetector CT imaging of the abdomen and pelvis was performed using the standard protocol following bolus administration of intravenous contrast. RADIATION DOSE REDUCTION: This exam was performed according to the departmental dose-optimization program which includes automated exposure control, adjustment of the mA and/or kV according to patient size and/or use of iterative reconstruction technique. CONTRAST:  OMNIPAQUE  IOHEXOL  300 MG/ML  SOLN COMPARISON:  None Available. FINDINGS: Lower chest: No acute abnormality. Hepatobiliary: No focal liver abnormality is seen. No gallstones, gallbladder wall thickening, or biliary dilatation. Pancreas: Unremarkable. Spleen: Unremarkable. Adrenals/Urinary Tract: Normal adrenal glands. No urinary calculi or hydronephrosis. Unremarkable bladder. Stomach/Bowel: Distended stomach. Enteric contrast  within the stomach and proximal small bowel upstream from dilated loops of small bowel with abrupt transition point in the right central abdomen (circa series 4/image 60). The bowel downstream from the transition point is decompressed. Normal caliber colon. The sigmoid colon herniates into the left inguinal hernia. No evidence of colonic obstruction. Normal appendix. Vascular/Lymphatic: Mild aortic atherosclerotic calcification. No lymphadenopathy. There is a heterogenous mass with internal calcification measuring 6.0 x 6.4 cm in the pelvis (series 2/image 69). Reproductive: Enlarged prostate. Other: No free intraperitoneal fluid or air. Musculoskeletal: No acute fracture. IMPRESSION: 1. Small-bowel obstruction with abrupt transition point in the right central abdomen. 2. Heterogenous mass with internal calcification measuring 6.0 x 6.4 cm in the pelvis. This is concerning for neoplasm. Recommend further evaluation with nonemergent pelvic MRI with and without contrast. 3. The sigmoid colon herniates into the left inguinal hernia. No evidence of colonic obstruction. 4. Enlarged prostate. 5. Aortic Atherosclerosis (ICD10-I70.0). Electronically Signed   By: Rozell Cornet M.D.   On: 07/23/2023 23:39     Assessment & Plan:  Brent Collins is a 73 y.o. male who was admitted with a COPD exacerbation and was found to have imaging findings concerning for small bowel obstruction.  Imaging and blood work evaluated by myself.  -Patient has had some flatus this morning.  KUB was ordered by myself, and demonstrates oral contrast throughout the colon.  He likely has a partial small bowel obstruction versus ileus. -Given contrast has progressed past the transition point on imaging, I feel we can slowly advance the patient's diet and see how he tolerates this -Will order bowel regimen -Okay for clear liquid diet, and may advance as tolerated -No need for NG tube  at this time.  If patient begins to have severe nausea  or vomiting, would recommend NG tube insertion -Monitor for return of bowel function -No acute surgical intervention at this time -Care per hospitalist  All questions were answered to the satisfaction of the patient.   Note: Portions of this report may have been transcribed using voice recognition software. Every effort has been made to ensure accuracy; however, inadvertent computerized transcription errors may still be present.   -- Lidia Reels, DO [facility] [address] [place], [place] [address] [phone] (office)

## 2023-07-25 ENCOUNTER — Inpatient Hospital Stay (HOSPITAL_COMMUNITY)

## 2023-07-25 DIAGNOSIS — J441 Chronic obstructive pulmonary disease with (acute) exacerbation: Secondary | ICD-10-CM | POA: Diagnosis not present

## 2023-07-25 DIAGNOSIS — K56609 Unspecified intestinal obstruction, unspecified as to partial versus complete obstruction: Secondary | ICD-10-CM | POA: Diagnosis not present

## 2023-07-25 DIAGNOSIS — R112 Nausea with vomiting, unspecified: Secondary | ICD-10-CM | POA: Diagnosis not present

## 2023-07-25 DIAGNOSIS — R1900 Intra-abdominal and pelvic swelling, mass and lump, unspecified site: Secondary | ICD-10-CM

## 2023-07-25 LAB — COMPREHENSIVE METABOLIC PANEL WITH GFR
ALT: 8 U/L (ref 0–44)
AST: 14 U/L — ABNORMAL LOW (ref 15–41)
Albumin: 3.5 g/dL (ref 3.5–5.0)
Alkaline Phosphatase: 62 U/L (ref 38–126)
Anion gap: 10 (ref 5–15)
BUN: 28 mg/dL — ABNORMAL HIGH (ref 8–23)
CO2: 30 mmol/L (ref 22–32)
Calcium: 9 mg/dL (ref 8.9–10.3)
Chloride: 98 mmol/L (ref 98–111)
Creatinine, Ser: 0.95 mg/dL (ref 0.61–1.24)
GFR, Estimated: >60 mL/min (ref >60)
Glucose, Bld: 109 mg/dL — ABNORMAL HIGH (ref 70–99)
Potassium: 4.4 mmol/L (ref 3.5–5.1)
Sodium: 138 mmol/L (ref 135–145)
Total Bilirubin: 1.1 mg/dL (ref 0.0–1.2)
Total Protein: 6.8 g/dL (ref 6.5–8.1)

## 2023-07-25 LAB — GLUCOSE, CAPILLARY
Glucose-Capillary: 90 mg/dL (ref 70–99)
Glucose-Capillary: 149 mg/dL — ABNORMAL HIGH (ref 70–99)
Glucose-Capillary: 289 mg/dL — ABNORMAL HIGH (ref 70–99)

## 2023-07-25 MED ORDER — PREDNISONE 10 MG PO TABS
50.0000 mg | ORAL_TABLET | Freq: Every day | ORAL | 0 refills | Status: AC
Start: 2023-07-26 — End: ?

## 2023-07-25 MED ORDER — PREDNISONE 20 MG PO TABS: 40.0000 mg | ORAL_TABLET | Freq: Every day | ORAL | Status: DC

## 2023-07-25 MED ORDER — GADOBUTROL 1 MMOL/ML IV SOLN
6.0000 mL | Freq: Once | INTRAVENOUS | Status: AC | PRN
  Administered 2023-07-25: 6 mL via INTRAVENOUS

## 2023-07-25 NOTE — Plan of Care (Signed)
   Problem: Education: Goal: Knowledge of General Education information will improve Description: Including pain rating scale, medication(s)/side effects and non-pharmacologic comfort measures Outcome: Progressing   Problem: Clinical Measurements: Goal: Ability to maintain clinical measurements within normal limits will improve Outcome: Progressing Goal: Diagnostic test results will improve Outcome: Progressing

## 2023-07-25 NOTE — Progress Notes (Signed)
 [facility] Progress Note     Subjective: Patient seen and examined.  He is resting comfortably in bed.  He is tolerating his clear liquid diet without nausea and vomiting.  He confirms that he is passing flatus and had another bowel movement this morning.  He denies abdominal pain.  Objective: Vital signs in last 24 hours: Temp:  [97.6 F (36.4 C)-98.1 F (36.7 C)] 97.6 F (36.4 C) (04/23 1301) Pulse Rate:  [64-92] 92 (04/23 1301) Resp:  [17-19] 17 (04/23 1301) BP: (133-153)/(65-85) 153/79 (04/23 1301) SpO2:  [95 %-100 %] 100 % (04/23 1301) Last BM Date : 07/22/23  Intake/Output from previous day: 04/22 0701 - 04/23 0700 In: 480 [P.O.:480] Out: -  Intake/Output this shift: Total I/O In: 600 [P.O.:600] Out: -   General appearance: alert, cooperative, and no distress GI: Abdomen soft, nondistended, no percussion tenderness, nontender to palpation; no rigidity, guarding, rebound tenderness; soft and reducible left inguinal hernia  Lab Results:  No results for input(s): "WBC", "HGB", "HCT", "PLT" in the last 72 hours. BMET Recent Labs    07/24/23 0510 07/25/23 0932  NA 141 138  K 4.5 4.4  CL 100 98  CO2 32 30  GLUCOSE 150* 109*  BUN 32* 28*  CREATININE 1.00 0.95  CALCIUM 8.9 9.0   PT/INR No results for input(s): "LABPROT", "INR" in the last 72 hours.  Studies/Results: DG Abd 1 View Result Date: 07/24/2023 CLINICAL DATA:  Abdominal distension EXAM: ABDOMEN - 1 VIEW COMPARISON:  CT from the previous day. FINDINGS: Administered contrast now lies within the colon consistent with a partial small bowel obstruction. Mild persistent small bowel dilatation is noted in the right hemiabdomen. No free air is seen. No bony abnormality is noted. IMPRESSION: Changes consistent with partial small bowel obstruction. The degree of small-bowel dilatation has improved from the prior CT. Electronically Signed   By: Violeta Grey M.D.   On: 07/24/2023 13:55   CT ABDOMEN  PELVIS W CONTRAST Result Date: 07/23/2023 CLINICAL DATA:  Acute nonlocalized abdominal pain and recurrent vomiting EXAM: CT ABDOMEN AND PELVIS WITH CONTRAST TECHNIQUE: Multidetector CT imaging of the abdomen and pelvis was performed using the standard protocol following bolus administration of intravenous contrast. RADIATION DOSE REDUCTION: This exam was performed according to the departmental dose-optimization program which includes automated exposure control, adjustment of the mA and/or kV according to patient size and/or use of iterative reconstruction technique. CONTRAST:  OMNIPAQUE  IOHEXOL  300 MG/ML  SOLN COMPARISON:  None Available. FINDINGS: Lower chest: No acute abnormality. Hepatobiliary: No focal liver abnormality is seen. No gallstones, gallbladder wall thickening, or biliary dilatation. Pancreas: Unremarkable. Spleen: Unremarkable. Adrenals/Urinary Tract: Normal adrenal glands. No urinary calculi or hydronephrosis. Unremarkable bladder. Stomach/Bowel: Distended stomach. Enteric contrast within the stomach and proximal small bowel upstream from dilated loops of small bowel with abrupt transition point in the right central abdomen (circa series 4/image 60). The bowel downstream from the transition point is decompressed. Normal caliber colon. The sigmoid colon herniates into the left inguinal hernia. No evidence of colonic obstruction. Normal appendix. Vascular/Lymphatic: Mild aortic atherosclerotic calcification. No lymphadenopathy. There is a heterogenous mass with internal calcification measuring 6.0 x 6.4 cm in the pelvis (series 2/image 69). Reproductive: Enlarged prostate. Other: No free intraperitoneal fluid or air. Musculoskeletal: No acute fracture. IMPRESSION: 1. Small-bowel obstruction with abrupt transition point in the right central abdomen. 2. Heterogenous mass with internal calcification measuring 6.0 x 6.4 cm in the pelvis. This is concerning for neoplasm. Recommend further evaluation  with nonemergent pelvic MRI with and without contrast. 3. The sigmoid colon herniates into the left inguinal hernia. No evidence of colonic obstruction. 4. Enlarged prostate. 5. Aortic Atherosclerosis (ICD10-I70.0). Electronically Signed   By: Rozell Cornet M.D.   On: 07/23/2023 23:39    Anti-infectives: Anti-infectives (From admission, onward)    None       Assessment/Plan:  Patient is a 73 year old male who was admitted with a COPD exacerbation was found to have imaging findings concerning for small bowel obstruction.  -Patient has had return of bowel function and is tolerating a diet without nausea and vomiting -Advance to GI soft diet -If patient tolerates solid food, he is stable for discharge from a general surgery standpoint -Continue bowel regimen with Senokot as, MiraLAX , Dulcolax suppositories -Monitor bowel function -No acute surgical intervention at this time -Care per hospitalist   LOS: 1 day    Diamond Martucci A Delia Slatten 07/25/2023  Note: Portions of this report may have been transcribed using voice recognition software. Every effort has been made to ensure accuracy; however, inadvertent computerized transcription errors may still be present.

## 2023-07-25 NOTE — Progress Notes (Signed)
 PROGRESS NOTE  Brent Collins ION:[id number] DOB: Jan 18, 1951 DOA: 07/22/2023 PCP: Patient, No Pcp Per  Brief History:  73 year old male with history of COPD and hypertension presenting with 2-day history of shortness of breath and worsening cough.  He has subjective fevers and chills at home.  He states that his cough has been largely nonproductive.  He lives with his sister and boyfriend both of whom smoke.  The patient himself states that he quit smoking 20 years ago.  He denies any chest pain, hemoptysis, nausea, vomiting or diarrhea, abdominal pain.  He denies any worsening leg edema.  The patient states that he has been using his his inhaler at home without much improvement.  As result he presented for further evaluation and treatment. In the ED, the patient was afebrile and hemodynamically stable with oxygen  saturation 77% on room air.  He was placed on 2 L with saturation up to 96%.  WBC 8.8, hemoglobin 16.6, platelets 195.  Sodium 142, potassium 4.6, bicarbonate 30, serum creatinine 0.95.  Troponin 8>> 7.  BNP 33.  Chest x-ray showed chronic interstitial markings.  COVID-19 PCR is negative.  EKG shows sinus rhythm with nonspecific T wave changes.  VBG showed 7.3 8/61/35/36.  The patient was started on bronchodilators and Solu-Medrol .  He was mated for further evaluation and treatment of his respiratory failure.  Unfortunately, his discharge is delayed due to development of recurrent N/V during the hospitalization.   CT abd/pelvis ordered.  The CT showed small bowel obstruction with abrupt transition point in the right central abdomen.  There is also a heterogenous mass with internal calcifications measuring 6.0 x 6.4 cm in the pelvis that was concerning for possible neoplasm.  Nonemergent pelvic MRI was recommended.  General surgery was consulted to assist with management.   Assessment/Plan: Acute respiratory failure with hypoxia and hypercarbia - Secondary to COPD exacerbation -  Stable on 2 L - Wean oxygen  for saturation greater 92% - now stable on RA   COPD exacerbation - continue Pulmicort  - Continue Brovana  - Continue Solu-Medrol >>prednisone  - COVID/RSV/Flu--neg -check viral resp panel--neg   Partial small bowel obstruction -Started IVF initially -changed to clear liquid>>soft diet -CT abd/pelvis--as discussed above - Appreciate general surgery consult - now having BMs   Essential hypertension - Currently not on any medications - Previously took carvedilol  and amlodipine  - Monitor BP for now   Diabetes mellitus type 2 -start sliding scale -check A1C--7.5   Pelvic mass -pelvic MRI-results pending -may need IR biopsy and referral to med/onc          Family Communication:  attempted to call all people on face sheet without success  Consultants:  general surgery  Code Status:  FULL   DVT Prophylaxis:  [place] Lovenox    Procedures: As Listed in Progress Note Above  Antibiotics: None      Subjective: Pt states he is breathing better.  Denies f/c, cp, n/v/ He is having BMs and tolerating diet  Objective: Vitals:   07/25/23 0822 07/25/23 0824 07/25/23 0833 07/25/23 1301  BP:    (!) 153/79  Pulse:    92  Resp:    17  Temp:    97.6 F (36.4 C)  TempSrc:      SpO2: 97% 100% 100% 100%  Weight:      Height:        Intake/Output Summary (Last 24 hours) at 07/25/2023 1632 Last data filed at 07/25/2023 1300 Gross per  24 hour  Intake 600 ml  Output --  Net 600 ml   Weight change:  Exam:  General:  Pt is alert, follows commands appropriately, not in acute distress HEENT: No icterus, No thrush, No neck mass, [place]/AT Cardiovascular: RRR, S1/S2, no rubs, no gallops Respiratory: bibasilar rales. No wheeze Abdomen: Soft/+BS, non tender, non distended, no guarding Extremities: No edema, No lymphangitis, No petechiae, No rashes, no synovitis   Data Reviewed: I have personally reviewed following labs and imaging studies Basic  Metabolic Panel: Recent Labs  Lab 07/22/23 0413 07/23/23 0453 07/24/23 0510 07/25/23 0932  NA 142 144 141 138  K 4.6 4.3 4.5 4.4  CL 103 103 100 98  CO2 30 30 32 30  GLUCOSE 150* 192* 150* 109*  BUN 20 29* 32* 28*  CREATININE 0.95 1.01 1.00 0.95  CALCIUM 9.4 10.0 8.9 9.0  MG  --  2.4 2.0  --   PHOS  --  3.6  --   --    Liver Function Tests: Recent Labs  Lab 07/24/23 0510 07/25/23 0932  AST 16 14*  ALT 10 8  ALKPHOS 63 62  BILITOT 0.5 1.1  PROT 6.4* 6.8  ALBUMIN 3.5 3.5   Recent Labs  Lab 07/24/23 0510  LIPASE 27   No results for input(s): "AMMONIA" in the last 168 hours. Coagulation Profile: No results for input(s): "INR", "PROTIME" in the last 168 hours. CBC: Recent Labs  Lab 07/22/23 0413  WBC 8.8  HGB 16.6  HCT 49.1  MCV 92.6  PLT 195   Cardiac Enzymes: No results for input(s): "CKTOTAL", "CKMB", "CKMBINDEX", "TROPONINI" in the last 168 hours. BNP: Invalid input(s): "POCBNP" CBG: Recent Labs  Lab 07/24/23 0711 07/24/23 1113 07/24/23 1608 07/24/23 2153 07/25/23 0721  GLUCAP 115* 140* 209* 159* 90   HbA1C: Recent Labs    07/24/23 0510  HGBA1C 7.5*   Urine analysis:    Component Value Date/Time   COLORURINE YELLOW 07/24/2023 1211   APPEARANCEUR CLEAR 07/24/2023 1211   LABSPEC 1.030 07/24/2023 1211   PHURINE 7.0 07/24/2023 1211   GLUCOSEU >=500 (A) 07/24/2023 1211   HGBUR NEGATIVE 07/24/2023 1211   BILIRUBINUR NEGATIVE 07/24/2023 1211   KETONESUR NEGATIVE 07/24/2023 1211   PROTEINUR NEGATIVE 07/24/2023 1211   NITRITE NEGATIVE 07/24/2023 1211   LEUKOCYTESUR NEGATIVE 07/24/2023 1211   Sepsis Labs: @LABRCNTIP (procalcitonin:4,lacticidven:4) ) Recent Results (from the past 240 hours)  Resp panel by RT-PCR (RSV, Flu A&B, Covid) Anterior Nasal Swab     Status: None   Collection Time: 07/22/23  4:23 AM   Specimen: Anterior Nasal Swab  Result Value Ref Range Status   SARS Coronavirus 2 by RT PCR NEGATIVE NEGATIVE Final    Comment:  (NOTE) SARS-CoV-2 target nucleic acids are NOT DETECTED.  The SARS-CoV-2 RNA is generally detectable in upper respiratory specimens during the acute phase of infection. The lowest concentration of SARS-CoV-2 viral copies this assay can detect is 138 copies/mL. A negative result does not preclude SARS-Cov-2 infection and should not be used as the sole basis for treatment or other patient management decisions. A negative result may occur with  improper specimen collection/handling, submission of specimen other than nasopharyngeal swab, presence of viral mutation(s) within the areas targeted by this assay, and inadequate number of viral copies(<138 copies/mL). A negative result must be combined with clinical observations, patient history, and epidemiological information. The expected result is Negative.  Fact Sheet for Patients:  BloggerCourse.com  Fact Sheet for Healthcare Providers:  SeriousBroker.it  This  test is no t yet approved or cleared by the [place]  FDA and  has been authorized for detection and/or diagnosis of SARS-CoV-2 by FDA under an Emergency Use Authorization (EUA). This EUA will remain  in effect (meaning this test can be used) for the duration of the COVID-19 declaration under Section 564(b)(1) of the Act, 21 U.S.C.section 360bbb-3(b)(1), unless the authorization is terminated  or revoked sooner.       Influenza A by PCR NEGATIVE NEGATIVE Final   Influenza B by PCR NEGATIVE NEGATIVE Final    Comment: (NOTE) The Xpert Xpress SARS-CoV-2/FLU/RSV plus assay is intended as an aid in the diagnosis of influenza from Nasopharyngeal swab specimens and should not be used as a sole basis for treatment. Nasal washings and aspirates are unacceptable for Xpert Xpress SARS-CoV-2/FLU/RSV testing.  Fact Sheet for Patients: BloggerCourse.com  Fact Sheet for Healthcare  Providers: SeriousBroker.it  This test is not yet approved or cleared by the [place]  FDA and has been authorized for detection and/or diagnosis of SARS-CoV-2 by FDA under an Emergency Use Authorization (EUA). This EUA will remain in effect (meaning this test can be used) for the duration of the COVID-19 declaration under Section 564(b)(1) of the Act, 21 U.S.C. section 360bbb-3(b)(1), unless the authorization is terminated or revoked.     Resp Syncytial Virus by PCR NEGATIVE NEGATIVE Final    Comment: (NOTE) Fact Sheet for Patients: BloggerCourse.com  Fact Sheet for Healthcare Providers: SeriousBroker.it  This test is not yet approved or cleared by the [place]  FDA and has been authorized for detection and/or diagnosis of SARS-CoV-2 by FDA under an Emergency Use Authorization (EUA). This EUA will remain in effect (meaning this test can be used) for the duration of the COVID-19 declaration under Section 564(b)(1) of the Act, 21 U.S.C. section 360bbb-3(b)(1), unless the authorization is terminated or revoked.  Performed at [facility], [address] [place], [place] [address]   Respiratory (~20 pathogens) panel by PCR     Status: None   Collection Time: 07/22/23  8:45 AM   Specimen: Nasopharyngeal Swab; Respiratory  Result Value Ref Range Status   Adenovirus NOT DETECTED NOT DETECTED Final   Coronavirus 229E NOT DETECTED NOT DETECTED Final    Comment: (NOTE) The Coronavirus on the Respiratory Panel, DOES NOT test for the novel  Coronavirus (2019 nCoV)    Coronavirus HKU1 NOT DETECTED NOT DETECTED Final   Coronavirus NL63 NOT DETECTED NOT DETECTED Final   Coronavirus OC43 NOT DETECTED NOT DETECTED Final   Metapneumovirus NOT DETECTED NOT DETECTED Final   Rhinovirus / Enterovirus NOT DETECTED NOT DETECTED Final   Influenza A NOT DETECTED NOT DETECTED Final   Influenza B NOT DETECTED NOT  DETECTED Final   Parainfluenza Virus 1 NOT DETECTED NOT DETECTED Final   Parainfluenza Virus 2 NOT DETECTED NOT DETECTED Final   Parainfluenza Virus 3 NOT DETECTED NOT DETECTED Final   Parainfluenza Virus 4 NOT DETECTED NOT DETECTED Final   Respiratory Syncytial Virus NOT DETECTED NOT DETECTED Final   Bordetella pertussis NOT DETECTED NOT DETECTED Final   Bordetella Parapertussis NOT DETECTED NOT DETECTED Final   Chlamydophila pneumoniae NOT DETECTED NOT DETECTED Final   Mycoplasma pneumoniae NOT DETECTED NOT DETECTED Final    Comment: Performed at [facility], [address], [place], [place] [address]     Scheduled Meds:  arformoterol   15 mcg Nebulization BID   bisacodyl   10 mg Rectal Daily   budesonide  (PULMICORT ) nebulizer solution  0.5 mg  Nebulization BID   enoxaparin  (LOVENOX ) injection  40 mg Subcutaneous Q24H   insulin  aspart  0-9 Units Subcutaneous TID WC   ipratropium-albuterol   3 mL Nebulization BID   methylPREDNISolone  (SOLU-MEDROL ) injection  40 mg Intravenous Q24H   pantoprazole  (PROTONIX ) IV  40 mg Intravenous Q12H   polyethylene glycol  17 g Oral Daily   senna-docusate  1 tablet Oral BID   Continuous Infusions:  Procedures/Studies: DG Abd 1 View Result Date: 07/24/2023 CLINICAL DATA:  Abdominal distension EXAM: ABDOMEN - 1 VIEW COMPARISON:  CT from the previous day. FINDINGS: Administered contrast now lies within the colon consistent with a partial small bowel obstruction. Mild persistent small bowel dilatation is noted in the right hemiabdomen. No free air is seen. No bony abnormality is noted. IMPRESSION: Changes consistent with partial small bowel obstruction. The degree of small-bowel dilatation has improved from the prior CT. Electronically Signed   By: Violeta Grey M.D.   On: 07/24/2023 13:55   CT ABDOMEN PELVIS W CONTRAST Result Date: 07/23/2023 CLINICAL DATA:  Acute nonlocalized abdominal pain and recurrent vomiting EXAM: CT ABDOMEN AND PELVIS WITH  CONTRAST TECHNIQUE: Multidetector CT imaging of the abdomen and pelvis was performed using the standard protocol following bolus administration of intravenous contrast. RADIATION DOSE REDUCTION: This exam was performed according to the departmental dose-optimization program which includes automated exposure control, adjustment of the mA and/or kV according to patient size and/or use of iterative reconstruction technique. CONTRAST:  OMNIPAQUE  IOHEXOL  300 MG/ML  SOLN COMPARISON:  None Available. FINDINGS: Lower chest: No acute abnormality. Hepatobiliary: No focal liver abnormality is seen. No gallstones, gallbladder wall thickening, or biliary dilatation. Pancreas: Unremarkable. Spleen: Unremarkable. Adrenals/Urinary Tract: Normal adrenal glands. No urinary calculi or hydronephrosis. Unremarkable bladder. Stomach/Bowel: Distended stomach. Enteric contrast within the stomach and proximal small bowel upstream from dilated loops of small bowel with abrupt transition point in the right central abdomen (circa series 4/image 60). The bowel downstream from the transition point is decompressed. Normal caliber colon. The sigmoid colon herniates into the left inguinal hernia. No evidence of colonic obstruction. Normal appendix. Vascular/Lymphatic: Mild aortic atherosclerotic calcification. No lymphadenopathy. There is a heterogenous mass with internal calcification measuring 6.0 x 6.4 cm in the pelvis (series 2/image 69). Reproductive: Enlarged prostate. Other: No free intraperitoneal fluid or air. Musculoskeletal: No acute fracture. IMPRESSION: 1. Small-bowel obstruction with abrupt transition point in the right central abdomen. 2. Heterogenous mass with internal calcification measuring 6.0 x 6.4 cm in the pelvis. This is concerning for neoplasm. Recommend further evaluation with nonemergent pelvic MRI with and without contrast. 3. The sigmoid colon herniates into the left inguinal hernia. No evidence of colonic  obstruction. 4. Enlarged prostate. 5. Aortic Atherosclerosis (ICD10-I70.0). Electronically Signed   By: Rozell Cornet M.D.   On: 07/23/2023 23:39   DG Chest Portable 1 View Result Date: 07/22/2023 CLINICAL DATA:  73 year old male with shortness of breath.  COPD. EXAM: PORTABLE CHEST 1 VIEW COMPARISON:  Chest radiographs 01/26/2023 and earlier. FINDINGS: Portable AP upright view at 0404 hours. Larger lung volumes compared to October. Coarse chronic symmetric pulmonary interstitium. Normal cardiac size and mediastinal contours. Visualized tracheal air column is within normal limits. No pneumothorax, pleural effusion or confluent lung opacity. Incidental right nipple shadow. Advanced chronic degeneration at both shoulders. No acute osseous abnormality identified. Negative visible bowel gas. IMPRESSION: Chronic pulmonary interstitial changes. No acute cardiopulmonary abnormality. Electronically Signed   By: Marlise Simpers M.D.   On: 07/22/2023 05:39    Demaris Fillers,  DO  [facility]  If 7PM-7AM, please contact night-coverage [email or web address] Password [email or web address] 07/25/2023, 4:32 PM   LOS: 1 day

## 2023-07-25 NOTE — Plan of Care (Signed)

## 2023-07-25 NOTE — Discharge Summary (Addendum)
 Physician Discharge Summary   Patient: Brent Collins MRN: [id number] DOB: 1950/09/04  Admit date:     07/22/2023  Discharge date: 07/25/23  Discharge Physician: Brent Collins Brent Collins   PCP: Brent Collins   Recommendations at discharge:  Follow up with oncology, Brent Collins or Brent Collins in 1-2  weeks. Patient's sister Brent Collins is primary contact [phone]   Hospital Course: 73 year old male with history of COPD and hypertension presenting with 2-day history of shortness of breath and worsening cough.  He has subjective fevers and chills at home.  He states that his cough has been largely nonproductive.  He lives with his sister and boyfriend both of whom smoke.  The patient himself states that he quit smoking 20 years ago.  He denies any chest pain, hemoptysis, nausea, vomiting or diarrhea, abdominal pain.  He denies any worsening leg edema.  The patient states that he has been using his his inhaler at home without much improvement.  As result he presented for further evaluation and treatment. In the ED, the patient was afebrile and hemodynamically stable with oxygen  saturation 77% on room air.  He was placed on 2 L with saturation up to 96%.  WBC 8.8, hemoglobin 16.6, platelets 195.  Sodium 142, potassium 4.6, bicarbonate 30, serum creatinine 0.95.  Troponin 8>> 7.  BNP 33.  Chest x-ray showed chronic interstitial markings.  COVID-19 PCR is negative.  EKG shows sinus rhythm with nonspecific T wave changes.  VBG showed 7.3 8/61/35/36.  The patient was started on bronchodilators and Solu-Medrol .  He was admitted for further evaluation and treatment of his respiratory failure.  Unfortunately, his discharge is delayed due to development of recurrent N/V during the hospitalization.   CT abd/pelvis ordered.  The CT showed small bowel obstruction with abrupt transition point in the right central abdomen.  There is also a heterogenous mass with internal calcifications measuring 6.0 x 6.4 cm in the pelvis  that was concerning for possible neoplasm.  Nonemergent pelvic MRI was recommended.  General surgery was consulted to assist with management.  Assessment and Plan: Acute respiratory failure with hypoxia and hypercarbia - Secondary to COPD exacerbation - Stable on 2 L - Wean oxygen  for saturation greater 92% - now stable on RA -ambulatory pulse ox--no desaturation   COPD exacerbation - continue Pulmicort  - Continue Brovana  - Continue Solu-Medrol >>prednisone  taper - COVID/RSV/Flu--neg -check viral resp panel--neg   Partial small bowel obstruction -Started IVF initially -changed to clear liquid>>soft diet -CT abd/pelvis--as discussed above - Appreciate general surgery consult - now having BMs   Essential hypertension - Currently not on any medications - Previously took carvedilol  and amlodipine  - Monitor BP for now   Diabetes mellitus type 2 -start sliding scale -check A1C--7.5 -out follow up    Pelvic mass -pelvic MRI- Layered calcification, some internal layered fat, and poor enhancement characterize the 5.8 cm pelvic mass which is shifted in position to the right anterior pelvis in this male patient. Imaging characteristics are more typical of rare lesions such as male dermoid or mesenchymal tumor.  -discussed with Brent Collins may ultimately need surgical oncologist -will refer patient to med onc for now to check appropriate tumor markers and best approach for tissue diagnosis -I have informed patient's sister and informed her about referral to [facility] and also provided her with the phone number to call if needed    Patient's sister Brent Collins is primary contact [phone]       Consultants: general surgery Procedures performed:  none  Disposition: Home Diet recommendation:  Carb modified diet DISCHARGE MEDICATION: Allergies as of 07/25/2023       Reactions   Tomato Shortness Of Breath, Nausea And Vomiting        Medication List     TAKE  these medications    albuterol  (2.5 MG/3ML) 0.083% nebulizer solution Commonly known as: PROVENTIL  Take 3 mLs (2.5 mg total) by nebulization every 6 (six) hours as needed for wheezing or shortness of breath.   albuterol  108 (90 Base) MCG/ACT inhaler Commonly known as: VENTOLIN  HFA Inhale 2 puffs into the lungs every 4 (four) hours as needed for wheezing or shortness of breath.   predniSONE  10 MG tablet Commonly known as: DELTASONE  Take 5 tablets (50 mg total) by mouth daily with breakfast. And decrease by one tablet daily Start taking on: July 26, 2023        Follow-up Information     Brent Collins Follow up in 1 week(s).   Specialty: Hematology Contact information: [address] [place] [address] [phone]                Discharge Exam: Brent Collins Weights   07/22/23 0353  Weight: 59 kg   HEENT:  [place]/AT, No thrush, no icterus CV:  RRR, no rub, no S3, no S4 Lung:  basilar rales.  No wheeze Abd:  soft/+BS, NT Ext:  No edema, no lymphangitis, no synovitis, no rash   Condition at discharge: stable  The results of significant diagnostics from this hospitalization (including imaging, microbiology, ancillary and laboratory) are listed below for reference.   Imaging Studies: MR PELVIS W WO CONTRAST Result Date: 07/25/2023 CLINICAL DATA:  Pelvic mass EXAM: MRI PELVIS WITHOUT AND WITH CONTRAST TECHNIQUE: Multiplanar multisequence MR imaging of the pelvis was performed both before and after administration of intravenous contrast. CONTRAST:  6mL GADAVIST  GADOBUTROL  1 MMOL/ML IV SOLN COMPARISON:  CT scan 07/23/2023 FINDINGS: Urinary Tract:  Distal ureters an urinary bladder unremarkable. Bowel:  Unremarkable Vascular/Lymphatic: Aortoiliac atherosclerosis. No pathologic adenopathy observed. Reproductive: Encapsulated nodularity in the transition zone compatible with benign prostatic hypertrophy. Prostate gland measures 5.8 by 4.5 by 4.3 cm (volume = 59 cm^3),  compatible with prostatomegaly. Other: In the right lower quadrant mesentery above the right anterior urinary bladder, a 6.1 by 5.5 by 5.6 cm mass demonstrates a layered appearance with high and low laminated T2 signal, generally intermediate T1 signal with crescentic low T1 signal centrally, and layered calcifications shown on the previous CT scan. Comparing in an out-of-phase images, some of the internal banding of this lesion has signal dropout on out-of-phase images compatible with intracellular lipid content. Moreover, fat saturated images likewise demonstrate some similar distribution of macroscopic adipose tissue Corinna Dickens within this lesion. This is Stadol shows the presence of both calcific and fatty elements. No substantial enhancement is identified. This mass has shifted in position compared to the prior CT scan, anterior and right lateral positioning currently. Trace free pelvic fluid, image 19 series 4. Musculoskeletal: Indirect left inguinal hernia contains part of the sigmoid colon. Laxity along the right inguinal ring suggesting subtle right groin hernia. IMPRESSION: 1. Layered calcification, some internal layered fat, and poor enhancement characterize the 5.8 cm pelvic mass which is shifted in position to the right anterior pelvis in this male patient. Imaging characteristics are more typical of rare lesions such as male dermoid or mesenchymal tumor. Gossypiboma might be considered if the patient has had prior abdominal surgery although the lesion would be somewhat atypical  for gossypiboma, GI stromal tumor, or peritoneal loose body given the fatty elements. Appearance likewise atypical for liposarcoma. Neuroendocrine tumors can present with calcification but would be much less likely to have fatty elements and hypoenhancement, and I do not see definite surrounding cicatricial reaction. Correlate with tumor markers such as alpha fetoprotein; resection is likely indicated. 2. Trace free pelvic fluid. 3.  Indirect left inguinal hernia contains part of the sigmoid colon. 4. Laxity along the right inguinal ring suggesting subtle right groin hernia. 5. Prostatomegaly with benign prostatic hypertrophy. 6. Aortic Atherosclerosis (ICD10-I70.0). Electronically Signed   By: Freida Jes M.D.   On: 07/25/2023 16:44   DG Abd 1 View Result Date: 07/24/2023 CLINICAL DATA:  Abdominal distension EXAM: ABDOMEN - 1 VIEW COMPARISON:  CT from the previous day. FINDINGS: Administered contrast now lies within the colon consistent with a partial small bowel obstruction. Mild persistent small bowel dilatation is noted in the right hemiabdomen. No free air is seen. No bony abnormality is noted. IMPRESSION: Changes consistent with partial small bowel obstruction. The degree of small-bowel dilatation has improved from the prior CT. Electronically Signed   By: Violeta Grey M.D.   On: 07/24/2023 13:55   CT ABDOMEN PELVIS W CONTRAST Result Date: 07/23/2023 CLINICAL DATA:  Acute nonlocalized abdominal pain and recurrent vomiting EXAM: CT ABDOMEN AND PELVIS WITH CONTRAST TECHNIQUE: Multidetector CT imaging of the abdomen and pelvis was performed using the standard protocol following bolus administration of intravenous contrast. RADIATION DOSE REDUCTION: This exam was performed according to the departmental dose-optimization program which includes automated exposure control, adjustment of the mA and/or kV according to patient size and/or use of iterative reconstruction technique. CONTRAST:  OMNIPAQUE  IOHEXOL  300 MG/ML  SOLN COMPARISON:  None Available. FINDINGS: Lower chest: No acute abnormality. Hepatobiliary: No focal liver abnormality is seen. No gallstones, gallbladder wall thickening, or biliary dilatation. Pancreas: Unremarkable. Spleen: Unremarkable. Adrenals/Urinary Tract: Normal adrenal glands. No urinary calculi or hydronephrosis. Unremarkable bladder. Stomach/Bowel: Distended stomach. Enteric contrast within the  stomach and proximal small bowel upstream from dilated loops of small bowel with abrupt transition point in the right central abdomen (circa series 4/image 60). The bowel downstream from the transition point is decompressed. Normal caliber colon. The sigmoid colon herniates into the left inguinal hernia. No evidence of colonic obstruction. Normal appendix. Vascular/Lymphatic: Mild aortic atherosclerotic calcification. No lymphadenopathy. There is a heterogenous mass with internal calcification measuring 6.0 x 6.4 cm in the pelvis (series 2/image 69). Reproductive: Enlarged prostate. Other: No free intraperitoneal fluid or air. Musculoskeletal: No acute fracture. IMPRESSION: 1. Small-bowel obstruction with abrupt transition point in the right central abdomen. 2. Heterogenous mass with internal calcification measuring 6.0 x 6.4 cm in the pelvis. This is concerning for neoplasm. Recommend further evaluation with nonemergent pelvic MRI with and without contrast. 3. The sigmoid colon herniates into the left inguinal hernia. No evidence of colonic obstruction. 4. Enlarged prostate. 5. Aortic Atherosclerosis (ICD10-I70.0). Electronically Signed   By: Rozell Cornet M.D.   On: 07/23/2023 23:39   DG Chest Portable 1 View Result Date: 07/22/2023 CLINICAL DATA:  73 year old male with shortness of breath.  COPD. EXAM: PORTABLE CHEST 1 VIEW COMPARISON:  Chest radiographs 01/26/2023 and earlier. FINDINGS: Portable AP upright view at 0404 hours. Larger lung volumes compared to October. Coarse chronic symmetric pulmonary interstitium. Normal cardiac size and mediastinal contours. Visualized tracheal air column is within normal limits. No pneumothorax, pleural effusion or confluent lung opacity. Incidental right nipple shadow. Advanced chronic degeneration at both  shoulders. No acute osseous abnormality identified. Negative visible bowel gas. IMPRESSION: Chronic pulmonary interstitial changes. No acute cardiopulmonary  abnormality. Electronically Signed   By: Marlise Simpers M.D.   On: 07/22/2023 05:39    Microbiology: Results for orders placed or performed during the hospital encounter of 07/22/23  Resp panel by RT-PCR (RSV, Flu A&B, Covid) Anterior Nasal Swab     Status: None   Collection Time: 07/22/23  4:23 AM   Specimen: Anterior Nasal Swab  Result Value Ref Range Status   SARS Coronavirus 2 by RT PCR NEGATIVE NEGATIVE Final    Comment: (NOTE) SARS-CoV-2 target nucleic acids are NOT DETECTED.  The SARS-CoV-2 RNA is generally detectable in upper respiratory specimens during the acute phase of infection. The lowest concentration of SARS-CoV-2 viral copies this assay can detect is 138 copies/mL. A negative result does not preclude SARS-Cov-2 infection and should not be used as the sole basis for treatment or other patient management decisions. A negative result may occur with  improper specimen collection/handling, submission of specimen other than nasopharyngeal swab, presence of viral mutation(s) within the areas targeted by this assay, and inadequate number of viral copies(<138 copies/mL). A negative result must be combined with clinical observations, patient history, and epidemiological information. The expected result is Negative.  Fact Sheet for Patients:  BloggerCourse.com  Fact Sheet for Healthcare Providers:  SeriousBroker.it  This test is no t yet approved or cleared by the [place]  FDA and  has been authorized for detection and/or diagnosis of SARS-CoV-2 by FDA under an Emergency Use Authorization (EUA). This EUA will remain  in effect (meaning this test can be used) for the duration of the COVID-19 declaration under Section 564(b)(1) of the Act, 21 U.S.C.section 360bbb-3(b)(1), unless the authorization is terminated  or revoked sooner.       Influenza A by PCR NEGATIVE NEGATIVE Final   Influenza B by PCR NEGATIVE NEGATIVE Final     Comment: (NOTE) The Xpert Xpress SARS-CoV-2/FLU/RSV plus assay is intended as an aid in the diagnosis of influenza from Nasopharyngeal swab specimens and should not be used as a sole basis for treatment. Nasal washings and aspirates are unacceptable for Xpert Xpress SARS-CoV-2/FLU/RSV testing.  Fact Sheet for Patients: BloggerCourse.com  Fact Sheet for Healthcare Providers: SeriousBroker.it  This test is not yet approved or cleared by the [place]  FDA and has been authorized for detection and/or diagnosis of SARS-CoV-2 by FDA under an Emergency Use Authorization (EUA). This EUA will remain in effect (meaning this test can be used) for the duration of the COVID-19 declaration under Section 564(b)(1) of the Act, 21 U.S.C. section 360bbb-3(b)(1), unless the authorization is terminated or revoked.     Resp Syncytial Virus by PCR NEGATIVE NEGATIVE Final    Comment: (NOTE) Fact Sheet for Patients: BloggerCourse.com  Fact Sheet for Healthcare Providers: SeriousBroker.it  This test is not yet approved or cleared by the [place]  FDA and has been authorized for detection and/or diagnosis of SARS-CoV-2 by FDA under an Emergency Use Authorization (EUA). This EUA will remain in effect (meaning this test can be used) for the duration of the COVID-19 declaration under Section 564(b)(1) of the Act, 21 U.S.C. section 360bbb-3(b)(1), unless the authorization is terminated or revoked.  Performed at [facility], [address], [place], [place] [address]   Respiratory (~20 pathogens) panel by PCR     Status: None   Collection Time: 07/22/23  8:45 AM   Specimen: Nasopharyngeal Swab; Respiratory  Result Value Ref Range  Status   Adenovirus NOT DETECTED NOT DETECTED Final   Coronavirus 229E NOT DETECTED NOT DETECTED Final    Comment: (NOTE) The Coronavirus on the Respiratory Panel,  DOES NOT test for the novel  Coronavirus (2019 nCoV)    Coronavirus HKU1 NOT DETECTED NOT DETECTED Final   Coronavirus NL63 NOT DETECTED NOT DETECTED Final   Coronavirus OC43 NOT DETECTED NOT DETECTED Final   Metapneumovirus NOT DETECTED NOT DETECTED Final   Rhinovirus / Enterovirus NOT DETECTED NOT DETECTED Final   Influenza A NOT DETECTED NOT DETECTED Final   Influenza B NOT DETECTED NOT DETECTED Final   Parainfluenza Virus 1 NOT DETECTED NOT DETECTED Final   Parainfluenza Virus 2 NOT DETECTED NOT DETECTED Final   Parainfluenza Virus 3 NOT DETECTED NOT DETECTED Final   Parainfluenza Virus 4 NOT DETECTED NOT DETECTED Final   Respiratory Syncytial Virus NOT DETECTED NOT DETECTED Final   Bordetella pertussis NOT DETECTED NOT DETECTED Final   Bordetella Parapertussis NOT DETECTED NOT DETECTED Final   Chlamydophila pneumoniae NOT DETECTED NOT DETECTED Final   Mycoplasma pneumoniae NOT DETECTED NOT DETECTED Final    Comment: Performed at [facility], [address], [place], [place] [address]    Labs: CBC: Recent Labs  Lab 07/22/23 0413  WBC 8.8  HGB 16.6  HCT 49.1  MCV 92.6  PLT 195   Basic Metabolic Panel: Recent Labs  Lab 07/22/23 0413 07/23/23 0453 07/24/23 0510 07/25/23 0932  NA 142 144 141 138  K 4.6 4.3 4.5 4.4  CL 103 103 100 98  CO2 30 30 32 30  GLUCOSE 150* 192* 150* 109*  BUN 20 29* 32* 28*  CREATININE 0.95 1.01 1.00 0.95  CALCIUM 9.4 10.0 8.9 9.0  MG  --  2.4 2.0  --   PHOS  --  3.6  --   --    Liver Function Tests: Recent Labs  Lab 07/24/23 0510 07/25/23 0932  AST 16 14*  ALT 10 8  ALKPHOS 63 62  BILITOT 0.5 1.1  PROT 6.4* 6.8  ALBUMIN 3.5 3.5   CBG: Recent Labs  Lab 07/24/23 1113 07/24/23 1608 07/24/23 2153 07/25/23 0721 07/25/23 1636  GLUCAP 140* 209* 159* 90 149*    Discharge time spent: greater than 30 minutes.  Signed: Demaris Fillers, [facility] 07/25/2023

## 2023-08-07 ENCOUNTER — Inpatient Hospital Stay: Admitting: Hematology

## 2023-08-07 ENCOUNTER — Inpatient Hospital Stay

## 2023-08-21 ENCOUNTER — Inpatient Hospital Stay

## 2023-08-21 ENCOUNTER — Inpatient Hospital Stay: Admitting: Oncology

## 2023-09-11 ENCOUNTER — Inpatient Hospital Stay: Attending: Oncology | Admitting: Oncology

## 2023-09-11 ENCOUNTER — Inpatient Hospital Stay

## 2023-09-11 VITALS — BP 136/64 | HR 71 | Temp 97.7°F | Resp 18 | Wt 135.5 lb

## 2023-09-11 DIAGNOSIS — R9389 Abnormal findings on diagnostic imaging of other specified body structures: Secondary | ICD-10-CM | POA: Diagnosis not present

## 2023-09-11 DIAGNOSIS — R978 Other abnormal tumor markers: Secondary | ICD-10-CM

## 2023-09-11 DIAGNOSIS — Z87891 Personal history of nicotine dependence: Secondary | ICD-10-CM | POA: Insufficient documentation

## 2023-09-11 DIAGNOSIS — C6212 Malignant neoplasm of descended left testis: Secondary | ICD-10-CM

## 2023-09-11 DIAGNOSIS — R1900 Intra-abdominal and pelvic swelling, mass and lump, unspecified site: Secondary | ICD-10-CM | POA: Diagnosis not present

## 2023-09-11 NOTE — Patient Instructions (Signed)
 VISIT SUMMARY:  Today, you were seen for a pelvic mass that was identified during a recent emergency room visit. You mentioned that you have had this mass for about ten to fifteen years, but previous evaluations did not provide a clear diagnosis. You also have a history of asthma, which you manage with inhalers, and a manually reducible hernia.  YOUR PLAN:  -PELVIC MASS: A pelvic mass is an abnormal growth in the pelvic area. We need to conduct further tests to determine if it is cancerous or benign. We will order blood work to check for cancer markers and refer you to surgeons for further evaluation and possible treatment.  -HERNIA: A hernia occurs when an organ pushes through an opening in the muscle or tissue that holds it in place. Your hernia is manually reducible, meaning it can be pushed back into place, and it is not related to the pelvic mass.  -ASTHMA: Asthma is a condition where your airways narrow and swell, making it difficult to breathe. Continue using your inhalers regularly to manage your symptoms.  INSTRUCTIONS:  Please complete the blood work for cancer markers as soon as possible. We will also schedule a referral to a surgeon for further evaluation of the pelvic mass. Continue using your asthma inhalers regularly. Follow up with us  after your surgical consultation or if you experience any new symptoms.

## 2023-09-11 NOTE — Progress Notes (Signed)
 Hematology-Oncology Clinic Note  Patient, No Pcp Per   Reason for Referral: Pelvic mass  Oncology History: I have reviewed his chart and materials related to his cancer extensively and collaborated history with the patient. Summary of oncologic history is as follows:  Oncology History  Pelvic mass  07/23/2023 Imaging   CT abdomen and pelvis with contrast:  IMPRESSION: 1. Small-bowel obstruction with abrupt transition point in the right central abdomen. 2. Heterogenous mass with internal calcification measuring 6.0 x 6.4 cm in the pelvis. This is concerning for neoplasm. Recommend further evaluation with nonemergent pelvic MRI with and without contrast. 3. The sigmoid colon herniates into the left inguinal hernia. No evidence of colonic obstruction.   07/25/2023 Initial Diagnosis   Pelvic mass   07/25/2023 Imaging   MRI pelvis with and without contrast:  IMPRESSION: 1. Layered calcification, some internal layered fat, and poor enhancement characterize the 5.8 cm pelvic mass which is shifted in position to the right anterior pelvis in this male patient. Imaging characteristics are more typical of rare lesions such as male dermoid or mesenchymal tumor.  2. Trace free pelvic fluid. 3. Indirect left inguinal hernia contains part of the sigmoid colon.       History of Presenting Illness: Brent Collins 73 y.o. male is here for pelvic mass. Patient has a past medical history of COPD and hypertension.  He was recently in the ER on 07/22/2023 for complaints of shortness of breath and wheezing.  CT scan obtained at this time showed a probable pelvic mass.  This was followed up by an MRI which confirmed the mass. Patient denies any previous abdominal procedures.  No significant weight loss, abdominal pain, nausea or vomiting.  He has no other complaints today  He lives in [place] with his sister and her boyfriend.  He is retired and previously worked in a [employer].  He  has a history of smoking but quit a long time ago and drinks alcohol occasionally.  He does not know of any family history of cancer.   Medical History: Past Medical History:  Diagnosis Date   Asthma    COPD (chronic obstructive pulmonary disease) (HCC)    Inguinal hernia     Surgical history: No past surgical history on file.   Allergies:  is allergic to tomato.  Medications:  Current Outpatient Medications  Medication Sig Dispense Refill   albuterol  (PROVENTIL ) (2.5 MG/3ML) 0.083% nebulizer solution Take 3 mLs (2.5 mg total) by nebulization every 6 (six) hours as needed for wheezing or shortness of breath. 75 mL 12   albuterol  (VENTOLIN  HFA) 108 (90 Base) MCG/ACT inhaler Inhale 2 puffs into the lungs every 4 (four) hours as needed for wheezing or shortness of breath. 1 each 2   predniSONE  (DELTASONE ) 10 MG tablet Take 5 tablets (50 mg total) by mouth daily with breakfast. And decrease by one tablet daily 15 tablet 0   No current facility-administered medications for this visit.    Review of Systems: Constitutional: Denies fevers, chills or abnormal night sweats Eyes: Denies blurriness of vision, double vision or watery eyes Ears, nose, mouth, throat, and face: Denies mucositis or sore throat Respiratory: Denies cough, dyspnea or wheezes Cardiovascular: Denies palpitation, chest discomfort or lower extremity swelling Gastrointestinal:  Denies nausea, heartburn or change in bowel habits Skin: Denies abnormal skin rashes Lymphatics: Denies new lymphadenopathy or easy bruising Neurological:Denies numbness, tingling or new weaknesses Behavioral/Psych: Mood is stable, no new changes  All other systems were reviewed  with the patient and are negative.  Physical Examination: ECOG PERFORMANCE STATUS: 1 - Symptomatic but completely ambulatory  Vitals:   09/11/23 1108  BP: 136/64  Pulse: 71  Resp: 18  Temp: 97.7 F (36.5 C)  SpO2: 96%   Filed Weights   09/11/23 1108   Weight: 135 lb 8 oz (61.5 kg)    GENERAL:alert, no distress and comfortable SKIN: skin color, texture, turgor are normal, no rashes or significant lesions LUNGS: clear to auscultation and percussion with normal breathing effort HEART: regular rate & rhythm and no murmurs and no lower extremity edema ABDOMEN:abdomen soft, non-tender and normal bowel sounds, left inguinal reducible hernia Musculoskeletal:no cyanosis of digits and no clubbing  PSYCH: alert & oriented x 3 with fluent speech NEURO: no focal motor/sensory deficits   Laboratory Data: I have reviewed the data as listed Lab Results  Component Value Date   WBC 8.8 07/22/2023   HGB 16.6 07/22/2023   HCT 49.1 07/22/2023   MCV 92.6 07/22/2023   PLT 195 07/22/2023   Recent Labs    09/24/22 2356 01/26/23 1545 07/22/23 0413 07/23/23 0453 07/24/23 0510 07/25/23 0932  NA 137 139   < > 144 141 138  K 4.7 4.5   < > 4.3 4.5 4.4  CL 100 102   < > 103 100 98  CO2 29 27   < > 30 32 30  GLUCOSE 212* 143*   < > 192* 150* 109*  BUN 20 23   < > 29* 32* 28*  CREATININE 1.00 0.92   < > 1.01 1.00 0.95  CALCIUM 9.1 9.1   < > 10.0 8.9 9.0  GFRNONAA >60 >60   < > >60 >60 >60  PROT 7.5 7.4  --   --  6.4* 6.8  ALBUMIN 3.8 4.2  --   --  3.5 3.5  AST 18 22  --   --  16 14*  ALT 10 11  --   --  10 8  ALKPHOS 72 64  --   --  63 62  BILITOT 0.5 1.0  --   --  0.5 1.1  BILIDIR 0.1  --   --   --   --   --   IBILI 0.4  --   --   --   --   --    < > = values in this interval not displayed.    Radiographic Studies: I have personally reviewed the radiological images as listed and agreed with the findings in the report.  MR PELVIS W WO CONTRAST CLINICAL DATA:  Pelvic mass  EXAM: MRI PELVIS WITHOUT AND WITH CONTRAST  TECHNIQUE: Multiplanar multisequence MR imaging of the pelvis was performed both before and after administration of intravenous contrast.  CONTRAST:  6mL GADAVIST  GADOBUTROL  1 MMOL/ML IV SOLN  COMPARISON:  CT scan  07/23/2023  FINDINGS: Urinary Tract:  Distal ureters an urinary bladder unremarkable.  Bowel:  Unremarkable  Vascular/Lymphatic: Aortoiliac atherosclerosis. No pathologic adenopathy observed.  Reproductive: Encapsulated nodularity in the transition zone compatible with benign prostatic hypertrophy. Prostate gland measures 5.8 by 4.5 by 4.3 cm (volume = 59 cm^3), compatible with prostatomegaly.  Other: In the right lower quadrant mesentery above the right anterior urinary bladder, a 6.1 by 5.5 by 5.6 cm mass demonstrates a layered appearance with high and low laminated T2 signal, generally intermediate T1 signal with crescentic low T1 signal centrally, and layered calcifications shown on the previous CT scan. Comparing in an out-of-phase images,  some of the internal banding of this lesion has signal dropout on out-of-phase images compatible with intracellular lipid content. Moreover, fat saturated images likewise demonstrate some similar distribution of macroscopic adipose tissue Corinna Dickens within this lesion. This is Stadol shows the presence of both calcific and fatty elements. No substantial enhancement is identified. This mass has shifted in position compared to the prior CT scan, anterior and right lateral positioning currently.  Trace free pelvic fluid, image 19 series 4.  Musculoskeletal: Indirect left inguinal hernia contains part of the sigmoid colon. Laxity along the right inguinal ring suggesting subtle right groin hernia.  IMPRESSION: 1. Layered calcification, some internal layered fat, and poor enhancement characterize the 5.8 cm pelvic mass which is shifted in position to the right anterior pelvis in this male patient. Imaging characteristics are more typical of rare lesions such as male dermoid or mesenchymal tumor. Gossypiboma might be considered if the patient has had prior abdominal surgery although the lesion would be somewhat atypical for gossypiboma, GI  stromal tumor, or peritoneal loose body given the fatty elements. Appearance likewise atypical for liposarcoma. Neuroendocrine tumors can present with calcification but would be much less likely to have fatty elements and hypoenhancement, and I do not see definite surrounding cicatricial reaction. Correlate with tumor markers such as alpha fetoprotein; resection is likely indicated. 2. Trace free pelvic fluid. 3. Indirect left inguinal hernia contains part of the sigmoid colon. 4. Laxity along the right inguinal ring suggesting subtle right groin hernia. 5. Prostatomegaly with benign prostatic hypertrophy. 6. Aortic Atherosclerosis (ICD10-I70.0).  Electronically Signed   By: Freida Jes M.D.   On: 07/25/2023 16:44    ASSESSMENT & PLAN:  Patient is a 73 year old male referred for pelvic mass   Pelvic mass Patient was recently diagnosed of the pelvic mass on CT scan No symptoms of abdominal pain, weight loss, loss of appetite, nausea, vomiting, constipation, diarrhea, hematuria  - Will refer to general surgery for possible excision of the mass.  If not feasible by them, can consider urology referral considering it is close to the bladder - Will send for tumor markers today including beta-hCG, AFP, CEA.  Return to clinic 1 week after biopsy/surgery to review results and further management   Orders Placed This Encounter  Procedures   AFP tumor marker    Standing Status:   Future    Number of Occurrences:   1    Expected Date:   09/11/2023    Expiration Date:   09/10/2024   HCG, tumor marker    Standing Status:   Future    Number of Occurrences:   1    Expected Date:   09/11/2023    Expiration Date:   09/10/2024   CEA    Standing Status:   Future    Number of Occurrences:   1    Expected Date:   09/11/2023    Expiration Date:   09/10/2024    The total time spent in the appointment was 60 minutes encounter with patients including review of chart and various tests  results, discussions about plan of care and coordination of care plan   All questions were answered. The patient knows to call the clinic with any problems, questions or concerns. No barriers to learning was detected.  Eduardo Grade, MD 6/13/20255:01 PM

## 2023-09-12 LAB — AFP TUMOR MARKER: AFP, Serum, Tumor Marker: 2.4 ng/mL (ref 0.0–8.4)

## 2023-09-12 LAB — BETA HCG QUANT (REF LAB): hCG Quant: <1 m[IU]/mL (ref 0–3)

## 2023-09-12 LAB — CEA: CEA: 4.4 ng/mL (ref 0.0–4.7)

## 2023-09-14 NOTE — Assessment & Plan Note (Signed)
 Patient was recently diagnosed of the pelvic mass on CT scan No symptoms of abdominal pain, weight loss, loss of appetite, nausea, vomiting, constipation, diarrhea, hematuria  - Will refer to general surgery for possible excision of the mass.  If not feasible by them, can consider urology referral considering it is close to the bladder - Will send for tumor markers today including beta-hCG, AFP, CEA.  Return to clinic 1 week after biopsy/surgery to review results and further management

## 2023-09-27 ENCOUNTER — Ambulatory Visit: Admitting: General Surgery

## 2023-10-08 ENCOUNTER — Inpatient Hospital Stay: Admitting: Oncology

## 2023-10-15 ENCOUNTER — Inpatient Hospital Stay: Admitting: Oncology

## 2023-10-22 ENCOUNTER — Inpatient Hospital Stay: Admitting: Oncology
